# Patient Record
Sex: Female | Born: 1980 | Race: White | Hispanic: No | Marital: Married | Smoking: Never smoker
Health system: Southern US, Community
[De-identification: ages and names within clinical notes are randomized; demographics above are authoritative.]

## PROBLEM LIST (undated history)

## (undated) DIAGNOSIS — Z9889 Other specified postprocedural states: Secondary | ICD-10-CM

## (undated) DIAGNOSIS — G43909 Migraine, unspecified, not intractable, without status migrainosus: Secondary | ICD-10-CM

## (undated) DIAGNOSIS — J302 Other seasonal allergic rhinitis: Secondary | ICD-10-CM

## (undated) DIAGNOSIS — R112 Nausea with vomiting, unspecified: Secondary | ICD-10-CM

## (undated) DIAGNOSIS — E063 Autoimmune thyroiditis: Secondary | ICD-10-CM

## (undated) HISTORY — DX: Migraine, unspecified, not intractable, without status migrainosus: G43.909

## (undated) HISTORY — DX: Other seasonal allergic rhinitis: J30.2

## (undated) HISTORY — PX: TONSILLECTOMY AND ADENOIDECTOMY: SHX28

---

## 2008-08-02 HISTORY — PX: KNEE ARTHROSCOPY: SHX127

## 2010-10-13 DIAGNOSIS — J309 Allergic rhinitis, unspecified: Secondary | ICD-10-CM | POA: Insufficient documentation

## 2011-11-20 DIAGNOSIS — M25569 Pain in unspecified knee: Secondary | ICD-10-CM | POA: Insufficient documentation

## 2011-12-04 HISTORY — PX: KNEE ARTHROSCOPY: SHX127

## 2011-12-21 DIAGNOSIS — M23302 Other meniscus derangements, unspecified lateral meniscus, unspecified knee: Secondary | ICD-10-CM | POA: Insufficient documentation

## 2013-11-02 NOTE — L&D Delivery Note (Signed)
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  Presentation: vertex; Position: Right occiput anterior; Station: +3.  Verbal consent: obtained from patient.  Risks and benefits discussed in detail.  Risks include, but are not limited to the risks of anesthesia, bleeding, infection, damage to maternal tissues, fetal cephalhematoma.  There is also the risk of inability to effect vaginal delivery of the head, or shoulder dystocia that cannot be resolved by established maneuvers, leading to the need for emergency cesarean section.  APGAR: 7, 9; weight  .   Placenta status: Intact, Spontaneous.   Cord: 3 vessels with the following complications: None.  Cord pH: 7.16 Double nuchal cord  Anesthesia: Epidural  Instruments: kiwi vacuum; 3 contractions total before delivery of head. Episiotomy: None Lacerations: 2nd degree;Perineal;Sulcus bilateral, minor labial lacerations bilateral Suture Repair: 2.0 vicryl adn 2.0 Monocryl Est. Blood Loss (mL):  550 (from lacerations, no uterine atony Tissue is very edematous and friable, small amount of oozing from minor abrasions.  Vaginal packing placed to help with hemostasis.  Foley to bladder overnight.  Toradol x1 for pain.  Narcotics as needed.  Mom to postpartum.  Baby to NICU.  Kostas Marrow H. 10/31/2014, 12:54 PM

## 2014-03-09 LAB — OB RESULTS CONSOLE PLATELET COUNT: Platelets: 228 10*3/uL

## 2014-03-09 LAB — OB RESULTS CONSOLE RUBELLA ANTIBODY, IGM: Rubella: IMMUNE

## 2014-03-09 LAB — OB RESULTS CONSOLE HGB/HCT, BLOOD
HCT: 38 %
Hemoglobin: 13.1 g/dL

## 2014-03-09 LAB — OB RESULTS CONSOLE VARICELLA ZOSTER ANTIBODY, IGG: VARICELLA IGG: IMMUNE

## 2014-03-09 LAB — OB RESULTS CONSOLE RPR: RPR: NONREACTIVE

## 2014-03-09 LAB — OB RESULTS CONSOLE HEPATITIS B SURFACE ANTIGEN: Hepatitis B Surface Ag: NEGATIVE

## 2014-03-09 LAB — OB RESULTS CONSOLE HIV ANTIBODY (ROUTINE TESTING): HIV: NONREACTIVE

## 2014-05-07 ENCOUNTER — Ambulatory Visit (INDEPENDENT_AMBULATORY_CARE_PROVIDER_SITE_OTHER): Payer: BC Managed Care – PPO | Admitting: Obstetrics & Gynecology

## 2014-05-07 ENCOUNTER — Encounter: Payer: Self-pay | Admitting: Obstetrics & Gynecology

## 2014-05-07 VITALS — BP 119/68 | HR 86 | Ht 66.0 in | Wt 149.0 lb

## 2014-05-07 DIAGNOSIS — Z3480 Encounter for supervision of other normal pregnancy, unspecified trimester: Secondary | ICD-10-CM

## 2014-05-07 DIAGNOSIS — Z3493 Encounter for supervision of normal pregnancy, unspecified, third trimester: Secondary | ICD-10-CM

## 2014-05-07 DIAGNOSIS — R519 Headache, unspecified: Secondary | ICD-10-CM

## 2014-05-07 DIAGNOSIS — O26893 Other specified pregnancy related conditions, third trimester: Secondary | ICD-10-CM

## 2014-05-07 DIAGNOSIS — Z3490 Encounter for supervision of normal pregnancy, unspecified, unspecified trimester: Secondary | ICD-10-CM | POA: Insufficient documentation

## 2014-05-07 DIAGNOSIS — O9989 Other specified diseases and conditions complicating pregnancy, childbirth and the puerperium: Secondary | ICD-10-CM

## 2014-05-07 DIAGNOSIS — R51 Headache: Secondary | ICD-10-CM

## 2014-05-07 MED ORDER — SUMATRIPTAN SUCCINATE 100 MG PO TABS: 100.0000 mg | ORAL_TABLET | Freq: Once | ORAL | Status: DC | PRN

## 2014-05-07 MED ORDER — BUTALBITAL-APAP-CAFFEINE 50-325-40 MG PO CAPS: 1.0000 | ORAL_CAPSULE | Freq: Four times a day (QID) | ORAL | Status: AC | PRN

## 2014-05-07 NOTE — Progress Notes (Signed)
Transfer of care from [facility]. Had normal InformaSeq (NIPS) testing. Anatomy scan ordered.  No problems so far except for headaches.  Discussed management options including Sumatriptan and Fioricet.  Both prescribed. May see Jannifer RodneyLinda Barefoot, NP (Headache Specialist) if needed. No other complaints or concerns.  Routine obstetric precautions reviewed.

## 2014-05-07 NOTE — Progress Notes (Signed)
Pt is having headaches and uses Tylenol but it doesn't seem to help much.

## 2014-05-07 NOTE — Patient Instructions (Signed)
Return to clinic for any obstetric concerns or go to [facility] for evaluation  

## 2014-05-28 ENCOUNTER — Ambulatory Visit (HOSPITAL_COMMUNITY)
Admission: RE | Admit: 2014-05-28 | Discharge: 2014-05-28 | Disposition: A | Discharge status: Home / Self Care | Payer: BC Managed Care – PPO | Source: Ambulatory Visit | Attending: Obstetrics & Gynecology | Admitting: Obstetrics & Gynecology

## 2014-05-28 ENCOUNTER — Encounter: Payer: Self-pay | Admitting: Obstetrics & Gynecology

## 2014-05-28 DIAGNOSIS — Z3689 Encounter for other specified antenatal screening: Secondary | ICD-10-CM | POA: Insufficient documentation

## 2014-05-28 DIAGNOSIS — Z3493 Encounter for supervision of normal pregnancy, unspecified, third trimester: Secondary | ICD-10-CM

## 2014-06-04 ENCOUNTER — Encounter: Payer: Self-pay | Admitting: Family Medicine

## 2014-06-04 ENCOUNTER — Ambulatory Visit (INDEPENDENT_AMBULATORY_CARE_PROVIDER_SITE_OTHER): Payer: BC Managed Care – PPO | Admitting: Family Medicine

## 2014-06-04 VITALS — BP 113/66 | HR 97 | Wt 154.0 lb

## 2014-06-04 DIAGNOSIS — Z3492 Encounter for supervision of normal pregnancy, unspecified, second trimester: Secondary | ICD-10-CM

## 2014-06-04 DIAGNOSIS — Z3480 Encounter for supervision of other normal pregnancy, unspecified trimester: Secondary | ICD-10-CM

## 2014-06-04 DIAGNOSIS — G43109 Migraine with aura, not intractable, without status migrainosus: Secondary | ICD-10-CM

## 2014-06-04 NOTE — Patient Instructions (Signed)
Second Trimester of Pregnancy The second trimester is from week 13 through week 28, months 4 through 6. The second trimester is often a time when you feel your best. Your body has also adjusted to being pregnant, and you begin to feel better physically. Usually, morning sickness has lessened or quit completely, you may have more energy, and you may have an increase in appetite. The second trimester is also a time when the fetus is growing rapidly. At the end of the sixth month, the fetus is about 9 inches long and weighs about 1 pounds. You will likely begin to feel the baby move (quickening) between 18 and 20 weeks of the pregnancy. BODY CHANGES Your body goes through many changes during pregnancy. The changes vary from woman to woman.   Your weight will continue to increase. You will notice your lower abdomen bulging out.  You may begin to get stretch marks on your hips, abdomen, and breasts.  You may develop headaches that can be relieved by medicines approved by your health care provider.  You may urinate more often because the fetus is pressing on your bladder.  You may develop or continue to have heartburn as a result of your pregnancy.  You may develop constipation because certain hormones are causing the muscles that push waste through your intestines to slow down.  You may develop hemorrhoids or swollen, bulging veins (varicose veins).  You may have back pain because of the weight gain and pregnancy hormones relaxing your joints between the bones in your pelvis and as a result of a shift in weight and the muscles that support your balance.  Your breasts will continue to grow and be tender.  Your gums may bleed and may be sensitive to brushing and flossing.  Dark spots or blotches (chloasma, mask of pregnancy) may develop on your face. This will likely fade after the baby is born.  A dark line from your belly button to the pubic area (linea nigra) may appear. This will likely  fade after the baby is born.  You may have changes in your hair. These can include thickening of your hair, rapid growth, and changes in texture. Some women also have hair loss during or after pregnancy, or hair that feels dry or thin. Your hair will most likely return to normal after your baby is born. WHAT TO EXPECT AT YOUR PRENATAL VISITS During a routine prenatal visit:  You will be weighed to make sure you and the fetus are growing normally.  Your blood pressure will be taken.  Your abdomen will be measured to track your baby's growth.  The fetal heartbeat will be listened to.  Any test results from the previous visit will be discussed. Your health care provider may ask you:  How you are feeling.  If you are feeling the baby move.  If you have had any abnormal symptoms, such as leaking fluid, bleeding, severe headaches, or abdominal cramping.  If you have any questions. Other tests that may be performed during your second trimester include:  Blood tests that check for:  Low iron levels (anemia).  Gestational diabetes (between 24 and 28 weeks).  Rh antibodies.  Urine tests to check for infections, diabetes, or protein in the urine.  An ultrasound to confirm the proper growth and development of the baby.  An amniocentesis to check for possible genetic problems.  Fetal screens for spina bifida and Down syndrome. HOME CARE INSTRUCTIONS   Avoid all smoking, herbs, alcohol, and unprescribed   drugs. These chemicals affect the formation and growth of the baby.  Follow your health care provider's instructions regarding medicine use. There are medicines that are either safe or unsafe to take during pregnancy.  Exercise only as directed by your health care provider. Experiencing uterine cramps is a good sign to stop exercising.  Continue to eat regular, healthy meals.  Wear a good support bra for breast tenderness.  Do not use hot tubs, steam rooms, or saunas.  Wear  your seat belt at all times when driving.  Avoid raw meat, uncooked cheese, cat litter boxes, and soil used by cats. These carry germs that can cause birth defects in the baby.  Take your prenatal vitamins.  Try taking a stool softener (if your health care provider approves) if you develop constipation. Eat more high-fiber foods, such as fresh vegetables or fruit and whole grains. Drink plenty of fluids to keep your urine clear or pale yellow.  Take warm sitz baths to soothe any pain or discomfort caused by hemorrhoids. Use hemorrhoid cream if your health care provider approves.  If you develop varicose veins, wear support hose. Elevate your feet for 15 minutes, 3-4 times a day. Limit salt in your diet.  Avoid heavy lifting, wear low heel shoes, and practice good posture.  Rest with your legs elevated if you have leg cramps or low back pain.  Visit your dentist if you have not gone yet during your pregnancy. Use a soft toothbrush to brush your teeth and be gentle when you floss.  A sexual relationship may be continued unless your health care provider directs you otherwise.  Continue to go to all your prenatal visits as directed by your health care provider. SEEK MEDICAL CARE IF:   You have dizziness.  You have mild pelvic cramps, pelvic pressure, or nagging pain in the abdominal area.  You have persistent nausea, vomiting, or diarrhea.  You have a bad smelling vaginal discharge.  You have pain with urination. SEEK IMMEDIATE MEDICAL CARE IF:   You have a fever.  You are leaking fluid from your vagina.  You have spotting or bleeding from your vagina.  You have severe abdominal cramping or pain.  You have rapid weight gain or loss.  You have shortness of breath with chest pain.  You notice sudden or extreme swelling of your face, hands, ankles, feet, or legs.  You have not felt your baby move in over an hour.  You have severe headaches that do not go away with  medicine.  You have vision changes. Document Released: 10/13/2001 Document Revised: 10/24/2013 Document Reviewed: 12/20/2012 Exit[employer] Patient Information 2015 [employer]. This information is not intended to replace advice given to you by your health care provider. Make sure you discuss any questions you have with your health care provider.  Breastfeeding Deciding to breastfeed is one of the best choices you can make for you and your baby. A change in hormones during pregnancy causes your breast tissue to grow and increases the number and size of your milk ducts. These hormones also allow proteins, sugars, and fats from your blood supply to make breast milk in your milk-producing glands. Hormones prevent breast milk from being released before your baby is born as well as prompt milk flow after birth. Once breastfeeding has begun, thoughts of your baby, as well as his or her sucking or crying, can stimulate the release of milk from your milk-producing glands.  BENEFITS OF BREASTFEEDING For Your Baby  Your first   milk (colostrum) helps your baby's digestive system function better.   There are antibodies in your milk that help your baby fight off infections.   Your baby has a lower incidence of asthma, allergies, and sudden infant death syndrome.   The nutrients in breast milk are better for your baby than infant formulas and are designed uniquely for your baby's needs.   Breast milk improves your baby's brain development.   Your baby is less likely to develop other conditions, such as childhood obesity, asthma, or type 2 diabetes mellitus.  For You   Breastfeeding helps to create a very special bond between you and your baby.   Breastfeeding is convenient. Breast milk is always available at the correct temperature and costs nothing.   Breastfeeding helps to burn calories and helps you lose the weight gained during pregnancy.   Breastfeeding makes your uterus contract to its  prepregnancy size faster and slows bleeding (lochia) after you give birth.   Breastfeeding helps to lower your risk of developing type 2 diabetes mellitus, osteoporosis, and breast or ovarian cancer later in life. SIGNS THAT YOUR BABY IS HUNGRY Early Signs of Hunger  Increased alertness or activity.  Stretching.  Movement of the head from side to side.  Movement of the head and opening of the mouth when the corner of the mouth or cheek is stroked (rooting).  Increased sucking sounds, smacking lips, cooing, sighing, or squeaking.  Hand-to-mouth movements.  Increased sucking of fingers or hands. Late Signs of Hunger  Fussing.  Intermittent crying. Extreme Signs of Hunger Signs of extreme hunger will require calming and consoling before your baby will be able to breastfeed successfully. Do not wait for the following signs of extreme hunger to occur before you initiate breastfeeding:   Restlessness.  A loud, strong cry.   Screaming. BREASTFEEDING BASICS Breastfeeding Initiation  Find a comfortable place to sit or lie down, with your neck and back well supported.  Place a pillow or rolled up blanket under your baby to bring him or her to the level of your breast (if you are seated). Nursing pillows are specially designed to help support your arms and your baby while you breastfeed.  Make sure that your baby's abdomen is facing your abdomen.   Gently massage your breast. With your fingertips, massage from your chest wall toward your nipple in a circular motion. This encourages milk flow. You may need to continue this action during the feeding if your milk flows slowly.  Support your breast with 4 fingers underneath and your thumb above your nipple. Make sure your fingers are well away from your nipple and your baby's mouth.   Stroke your baby's lips gently with your finger or nipple.   When your baby's mouth is open wide enough, quickly bring your baby to your breast,  placing your entire nipple and as much of the colored area around your nipple (areola) as possible into your baby's mouth.   More areola should be visible above your baby's upper lip than below the lower lip.   Your baby's tongue should be between his or her lower gum and your breast.   Ensure that your baby's mouth is correctly positioned around your nipple (latched). Your baby's lips should create a seal on your breast and be turned out (everted).  It is common for your baby to suck about 2-3 minutes in order to start the flow of breast milk. Latching Teaching your baby how to latch on to your breast   properly is very important. An improper latch can cause nipple pain and decreased milk supply for you and poor weight gain in your baby. Also, if your baby is not latched onto your nipple properly, he or she may swallow some air during feeding. This can make your baby fussy. Burping your baby when you switch breasts during the feeding can help to get rid of the air. However, teaching your baby to latch on properly is still the best way to prevent fussiness from swallowing air while breastfeeding. Signs that your baby has successfully latched on to your nipple:    Silent tugging or silent sucking, without causing you pain.   Swallowing heard between every 3-4 sucks.    Muscle movement above and in front of his or her ears while sucking.  Signs that your baby has not successfully latched on to nipple:   Sucking sounds or smacking sounds from your baby while breastfeeding.  Nipple pain. If you think your baby has not latched on correctly, slip your finger into the corner of your baby's mouth to break the suction and place it between your baby's gums. Attempt breastfeeding initiation again. Signs of Successful Breastfeeding Signs from your baby:   A gradual decrease in the number of sucks or complete cessation of sucking.   Falling asleep.   Relaxation of his or her body.    Retention of a small amount of milk in his or her mouth.   Letting go of your breast by himself or herself. Signs from you:  Breasts that have increased in firmness, weight, and size 1-3 hours after feeding.   Breasts that are softer immediately after breastfeeding.  Increased milk volume, as well as a change in milk consistency and color by the fifth day of breastfeeding.   Nipples that are not sore, cracked, or bleeding. Signs That Your Baby is Getting Enough Milk  Wetting at least 3 diapers in a 24-hour period. The urine should be clear and pale yellow by age 5 days.  At least 3 stools in a 24-hour period by age 5 days. The stool should be soft and yellow.  At least 3 stools in a 24-hour period by age 7 days. The stool should be seedy and yellow.  No loss of weight greater than 10% of birth weight during the first 3 days of age.  Average weight gain of 4-7 ounces (113-198 g) per week after age 4 days.  Consistent daily weight gain by age 5 days, without weight loss after the age of 2 weeks. After a feeding, your baby may spit up a small amount. This is common. BREASTFEEDING FREQUENCY AND DURATION Frequent feeding will help you make more milk and can prevent sore nipples and breast engorgement. Breastfeed when you feel the need to reduce the fullness of your breasts or when your baby shows signs of hunger. This is called "breastfeeding on demand." Avoid introducing a pacifier to your baby while you are working to establish breastfeeding (the first 4-6 weeks after your baby is born). After this time you may choose to use a pacifier. Research has shown that pacifier use during the first year of a baby's life decreases the risk of sudden infant death syndrome (SIDS). Allow your baby to feed on each breast as long as he or she wants. Breastfeed until your baby is finished feeding. When your baby unlatches or falls asleep while feeding from the first breast, offer the second breast.  Because newborns are often sleepy in the   first few weeks of life, you may need to awaken your baby to get him or her to feed. Breastfeeding times will vary from baby to baby. However, the following rules can serve as a guide to help you ensure that your baby is properly fed:  Newborns (babies 4 weeks of age or younger) may breastfeed every 1-3 hours.  Newborns should not go longer than 3 hours during the day or 5 hours during the night without breastfeeding.  You should breastfeed your baby a minimum of 8 times in a 24-hour period until you begin to introduce solid foods to your baby at around 6 months of age. BREAST MILK PUMPING Pumping and storing breast milk allows you to ensure that your baby is exclusively fed your breast milk, even at times when you are unable to breastfeed. This is especially important if you are going back to work while you are still breastfeeding or when you are not able to be present during feedings. Your lactation consultant can give you guidelines on how long it is safe to store breast milk.  A breast pump is a machine that allows you to pump milk from your breast into a sterile bottle. The pumped breast milk can then be stored in a refrigerator or freezer. Some breast pumps are operated by hand, while others use electricity. Ask your lactation consultant which type will work best for you. Breast pumps can be purchased, but some hospitals and breastfeeding support groups lease breast pumps on a monthly basis. A lactation consultant can teach you how to hand express breast milk, if you prefer not to use a pump.  CARING FOR YOUR BREASTS WHILE YOU BREASTFEED Nipples can become dry, cracked, and sore while breastfeeding. The following recommendations can help keep your breasts moisturized and healthy:  Avoid using soap on your nipples.   Wear a supportive bra. Although not required, special nursing bras and tank tops are designed to allow access to your breasts for  breastfeeding without taking off your entire bra or top. Avoid wearing underwire-style bras or extremely tight bras.  Air dry your nipples for 3-4minutes after each feeding.   Use only cotton bra pads to absorb leaked breast milk. Leaking of breast milk between feedings is normal.   Use lanolin on your nipples after breastfeeding. Lanolin helps to maintain your skin's normal moisture barrier. If you use pure lanolin, you do not need to wash it off before feeding your baby again. Pure lanolin is not toxic to your baby. You may also hand express a few drops of breast milk and gently massage that milk into your nipples and allow the milk to air dry. In the first few weeks after giving birth, some women experience extremely full breasts (engorgement). Engorgement can make your breasts feel heavy, warm, and tender to the touch. Engorgement peaks within 3-5 days after you give birth. The following recommendations can help ease engorgement:  Completely empty your breasts while breastfeeding or pumping. You may want to start by applying warm, moist heat (in the shower or with warm water-soaked hand towels) just before feeding or pumping. This increases circulation and helps the milk flow. If your baby does not completely empty your breasts while breastfeeding, pump any extra milk after he or she is finished.  Wear a snug bra (nursing or regular) or tank top for 1-2 days to signal your body to slightly decrease milk production.  Apply ice packs to your breasts, unless this is too uncomfortable for you.    Make sure that your baby is latched on and positioned properly while breastfeeding. If engorgement persists after 48 hours of following these recommendations, contact your health care provider or a lactation consultant. OVERALL HEALTH CARE RECOMMENDATIONS WHILE BREASTFEEDING  Eat healthy foods. Alternate between meals and snacks, eating 3 of each per day. Because what you eat affects your breast milk,  some of the foods may make your baby more irritable than usual. Avoid eating these foods if you are sure that they are negatively affecting your baby.  Drink milk, fruit juice, and water to satisfy your thirst (about 10 glasses a day).   Rest often, relax, and continue to take your prenatal vitamins to prevent fatigue, stress, and anemia.  Continue breast self-awareness checks.  Avoid chewing and smoking tobacco.  Avoid alcohol and drug use. Some medicines that may be harmful to your baby can pass through breast milk. It is important to ask your health care provider before taking any medicine, including all over-the-counter and prescription medicine as well as vitamin and herbal supplements. It is possible to become pregnant while breastfeeding. If birth control is desired, ask your health care provider about options that will be safe for your baby. SEEK MEDICAL CARE IF:   You feel like you want to stop breastfeeding or have become frustrated with breastfeeding.  You have painful breasts or nipples.  Your nipples are cracked or bleeding.  Your breasts are red, tender, or warm.  You have a swollen area on either breast.  You have a fever or chills.  You have nausea or vomiting.  You have drainage other than breast milk from your nipples.  Your breasts do not become full before feedings by the fifth day after you give birth.  You feel sad and depressed.  Your baby is too sleepy to eat well.  Your baby is having trouble sleeping.   Your baby is wetting less than 3 diapers in a 24-hour period.  Your baby has less than 3 stools in a 24-hour period.  Your baby's skin or the white part of his or her eyes becomes yellow.   Your baby is not gaining weight by 5 days of age. SEEK IMMEDIATE MEDICAL CARE IF:   Your baby is overly tired (lethargic) and does not want to wake up and feed.  Your baby develops an unexplained fever. Document Released: 10/19/2005 Document Revised:  10/24/2013 Document Reviewed: 04/12/2013 [employer] Patient Information 2015 [employer]. This information is not intended to replace advice given to you by your health care provider. Make sure you discuss any questions you have with your health care provider.  

## 2014-06-04 NOTE — Progress Notes (Signed)
Doing well Normal anatomy 

## 2014-07-02 ENCOUNTER — Ambulatory Visit (INDEPENDENT_AMBULATORY_CARE_PROVIDER_SITE_OTHER): Payer: BC Managed Care – PPO | Admitting: Obstetrics & Gynecology

## 2014-07-02 ENCOUNTER — Encounter: Payer: Self-pay | Admitting: Obstetrics & Gynecology

## 2014-07-02 VITALS — BP 116/68 | HR 95 | Wt 159.0 lb

## 2014-07-02 DIAGNOSIS — Z3480 Encounter for supervision of other normal pregnancy, unspecified trimester: Secondary | ICD-10-CM

## 2014-07-02 DIAGNOSIS — Z3492 Encounter for supervision of normal pregnancy, unspecified, second trimester: Secondary | ICD-10-CM

## 2014-07-02 NOTE — Progress Notes (Signed)
Continue Fioricet for migraines for now, will hold Imitrex.1 hr GTT/third trimester labs, Tdap next visit.  No other complaints or concerns.  Preterm labor and fetal movement precautions reviewed.

## 2014-07-02 NOTE — Patient Instructions (Addendum)
Return to clinic for any obstetric concerns or go to [facility] for evaluation Tdap Vaccine (Tetanus, Diphtheria, Pertussis): What You Need to Know 1. Why get vaccinated? Tetanus, diphtheria and pertussis can be very serious diseases, even for adolescents and adults. Tdap vaccine can protect us from these diseases. TETANUS (Lockjaw) causes painful muscle tightening and stiffness, usually all over the body.  It can lead to tightening of muscles in the head and neck so you can't open your mouth, swallow, or sometimes even breathe. Tetanus kills about 1 out of 5 people who are infected. DIPHTHERIA can cause a thick coating to form in the back of the throat.  It can lead to breathing problems, paralysis, heart failure, and death. PERTUSSIS (Whooping Cough) causes severe coughing spells, which can cause difficulty breathing, vomiting and disturbed sleep.  It can also lead to weight loss, incontinence, and rib fractures. Up to 2 in 100 adolescents and 5 in 100 adults with pertussis are hospitalized or have complications, which could include pneumonia or death. These diseases are caused by bacteria. Diphtheria and pertussis are spread from person to person through coughing or sneezing. Tetanus enters the body through cuts, scratches, or wounds. Before vaccines, the [place] saw as many as 200,000 cases a year of diphtheria and pertussis, and hundreds of cases of tetanus. Since vaccination began, tetanus and diphtheria have dropped by about 99% and pertussis by about 80%. 2. Tdap vaccine Tdap vaccine can protect adolescents and adults from tetanus, diphtheria, and pertussis. One dose of Tdap is routinely given at age 11 or 12. People who did not get Tdap at that age should get it as soon as possible. Tdap is especially important for health care professionals and anyone having close contact with a baby younger than 12 months. Pregnant women should get a dose of Tdap during every pregnancy, to protect the  newborn from pertussis. Infants are most at risk for severe, life-threatening complications from pertussis. A similar vaccine, called Td, protects from tetanus and diphtheria, but not pertussis. A Td booster should be given every 10 years. Tdap may be given as one of these boosters if you have not already gotten a dose. Tdap may also be given after a severe cut or burn to prevent tetanus infection. Your doctor can give you more information. Tdap may safely be given at the same time as other vaccines. 3. Some people should not get this vaccine  If you ever had a life-threatening allergic reaction after a dose of any tetanus, diphtheria, or pertussis containing vaccine, OR if you have a severe allergy to any part of this vaccine, you should not get Tdap. Tell your doctor if you have any severe allergies.  If you had a coma, or long or multiple seizures within 7 days after a childhood dose of DTP or DTaP, you should not get Tdap, unless a cause other than the vaccine was found. You can still get Td.  Talk to your doctor if you:  have epilepsy or another nervous system problem,  had severe pain or swelling after any vaccine containing diphtheria, tetanus or pertussis,  ever had Guillain-Barr Syndrome (GBS),  aren't feeling well on the day the shot is scheduled. 4. Risks of a vaccine reaction With any medicine, including vaccines, there is a chance of side effects. These are usually mild and go away on their own, but serious reactions are also possible. Brief fainting spells can follow a vaccination, leading to injuries from falling. Sitting or lying down for   about 15 minutes can help prevent these. Tell your doctor if you feel dizzy or light-headed, or have vision changes or ringing in the ears. Mild problems following Tdap (Did not interfere with activities)  Pain where the shot was given (about 3 in 4 adolescents or 2 in 3 adults)  Redness or swelling where the shot was given (about 1  person in 5)  Mild fever of at least 100.51F (up to about 1 in 25 adolescents or 1 in 100 adults)  Headache (about 3 or 4 people in 10)  Tiredness (about 1 person in 3 or 4)  Nausea, vomiting, diarrhea, stomach ache (up to 1 in 4 adolescents or 1 in 10 adults)  Chills, body aches, sore joints, rash, swollen glands (uncommon) Moderate problems following Tdap (Interfered with activities, but did not require medical attention)  Pain where the shot was given (about 1 in 5 adolescents or 1 in 100 adults)  Redness or swelling where the shot was given (up to about 1 in 16 adolescents or 1 in 25 adults)  Fever over 102F (about 1 in 100 adolescents or 1 in 250 adults)  Headache (about 3 in 20 adolescents or 1 in 10 adults)  Nausea, vomiting, diarrhea, stomach ache (up to 1 or 3 people in 100)  Swelling of the entire arm where the shot was given (up to about 3 in 100). Severe problems following Tdap (Unable to perform usual activities; required medical attention)  Swelling, severe pain, bleeding and redness in the arm where the shot was given (rare). A severe allergic reaction could occur after any vaccine (estimated less than 1 in a million doses). 5. What if there is a serious reaction? What should I look for?  Look for anything that concerns you, such as signs of a severe allergic reaction, very high fever, or behavior changes. Signs of a severe allergic reaction can include hives, swelling of the face and throat, difficulty breathing, a fast heartbeat, dizziness, and weakness. These would start a few minutes to a few hours after the vaccination. What should I do?  If you think it is a severe allergic reaction or other emergency that can't wait, call 9-1-1 or get the person to the nearest hospital. Otherwise, call your doctor.  Afterward, the reaction should be reported to the "Vaccine Adverse Event Reporting System" (VAERS). Your doctor might file this report, or you can do it  yourself through the VAERS web site at www.vaers.LAgents.no, or by calling 1-(509) 411-9615. VAERS is only for reporting reactions. They do not give medical advice.  6. The National Vaccine Injury Compensation Program The [employer] Vaccine Injury Compensation Program (VICP) is a federal program that was created to compensate people who may have been injured by certain vaccines. Persons who believe they may have been injured by a vaccine can learn about the program and about filing a claim by calling 1-815-790-6534 or visiting the VICP website at SpiritualWord.at. 7. How can I learn more?  Ask your doctor.  Call your local or state health department.  Contact the Centers for Disease Control and Prevention (CDC):  Call 352-190-3838 or visit CDC's website at PicCapture.uy. CDC Tdap Vaccine VIS (03/10/12) Document Released: 04/19/2012 Document Revised: 03/05/2014 Document Reviewed: 01/31/2014 [employer] Patient Information 2015 [place], [place]. This information is not intended to replace advice given to you by your health care provider. Make sure you discuss any questions you have with your health care provider.  Contraception Choices Contraception (birth control) is the use of any methods  or devices to prevent pregnancy. Below are some methods to help avoid pregnancy. HORMONAL METHODS   Contraceptive implant. This is a thin, plastic tube containing progesterone hormone. It does not contain estrogen hormone. Your health care provider inserts the tube in the inner part of the upper arm. The tube can remain in place for up to 3 years. After 3 years, the implant must be removed. The implant prevents the ovaries from releasing an egg (ovulation), thickens the cervical mucus to prevent sperm from entering the uterus, and thins the lining of the inside of the uterus.  Progesterone-only injections. These injections are given every 3 months by your health care provider to prevent  pregnancy. This synthetic progesterone hormone stops the ovaries from releasing eggs. It also thickens cervical mucus and changes the uterine lining. This makes it harder for sperm to survive in the uterus.  Birth control pills. These pills contain estrogen and progesterone hormone. They work by preventing the ovaries from releasing eggs (ovulation). They also cause the cervical mucus to thicken, preventing the sperm from entering the uterus. Birth control pills are prescribed by a health care provider.Birth control pills can also be used to treat heavy periods.  Minipill. This type of birth control pill contains only the progesterone hormone. They are taken every day of each month and must be prescribed by your health care provider.  Birth control patch. The patch contains hormones similar to those in birth control pills. It must be changed once a week and is prescribed by a health care provider.  Vaginal ring. The ring contains hormones similar to those in birth control pills. It is left in the vagina for 3 weeks, removed for 1 week, and then a new one is put back in place. The patient must be comfortable inserting and removing the ring from the vagina.A health care provider's prescription is necessary.  Emergency contraception. Emergency contraceptives prevent pregnancy after unprotected sexual intercourse. This pill can be taken right after sex or up to 5 days after unprotected sex. It is most effective the sooner you take the pills after having sexual intercourse. Most emergency contraceptive pills are available without a prescription. Check with your pharmacist. Do not use emergency contraception as your only form of birth control. BARRIER METHODS   Female condom. This is a thin sheath (latex or rubber) that is worn over the penis during sexual intercourse. It can be used with spermicide to increase effectiveness.  Female condom. This is a soft, loose-fitting sheath that is put into the vagina  before sexual intercourse.  Diaphragm. This is a soft, latex, dome-shaped barrier that must be fitted by a health care provider. It is inserted into the vagina, along with a spermicidal jelly. It is inserted before intercourse. The diaphragm should be left in the vagina for 6 to 8 hours after intercourse.  Cervical cap. This is a round, soft, latex or plastic cup that fits over the cervix and must be fitted by a health care provider. The cap can be left in place for up to 48 hours after intercourse.  Sponge. This is a soft, circular piece of polyurethane foam. The sponge has spermicide in it. It is inserted into the vagina after wetting it and before sexual intercourse.  Spermicides. These are chemicals that kill or block sperm from entering the cervix and uterus. They come in the form of creams, jellies, suppositories, foam, or tablets. They do not require a prescription. They are inserted into the vagina with an applicator  before having sexual intercourse. The process must be repeated every time you have sexual intercourse. INTRAUTERINE CONTRACEPTION  Intrauterine device (IUD). This is a T-shaped device that is put in a woman's uterus during a menstrual period to prevent pregnancy. There are 2 types:  Copper IUD. This type of IUD is wrapped in copper wire and is placed inside the uterus. Copper makes the uterus and fallopian tubes produce a fluid that kills sperm. It can stay in place for 10 years.  Hormone IUD. This type of IUD contains the hormone progestin (synthetic progesterone). The hormone thickens the cervical mucus and prevents sperm from entering the uterus, and it also thins the uterine lining to prevent implantation of a fertilized egg. The hormone can weaken or kill the sperm that get into the uterus. It can stay in place for 3-5 years, depending on which type of IUD is used. PERMANENT METHODS OF CONTRACEPTION  Female tubal ligation. This is when the woman's fallopian tubes are  surgically sealed, tied, or blocked to prevent the egg from traveling to the uterus.  Hysteroscopic sterilization. This involves placing a small coil or insert into each fallopian tube. Your doctor uses a technique called hysteroscopy to do the procedure. The device causes scar tissue to form. This results in permanent blockage of the fallopian tubes, so the sperm cannot fertilize the egg. It takes about 3 months after the procedure for the tubes to become blocked. You must use another form of birth control for these 3 months.  Female sterilization. This is when the female has the tubes that carry sperm tied off (vasectomy).This blocks sperm from entering the vagina during sexual intercourse. After the procedure, the man can still ejaculate fluid (semen). NATURAL PLANNING METHODS  Natural family planning. This is not having sexual intercourse or using a barrier method (condom, diaphragm, cervical cap) on days the woman could become pregnant.  Calendar method. This is keeping track of the length of each menstrual cycle and identifying when you are fertile.  Ovulation method. This is avoiding sexual intercourse during ovulation.  Symptothermal method. This is avoiding sexual intercourse during ovulation, using a thermometer and ovulation symptoms.  Post-ovulation method. This is timing sexual intercourse after you have ovulated. Regardless of which type or method of contraception you choose, it is important that you use condoms to protect against the transmission of sexually transmitted infections (STIs). Talk with your health care provider about which form of contraception is most appropriate for you. Document Released: 10/19/2005 Document Revised: 10/24/2013 Document Reviewed: 04/13/2013 [facility] Patient Information 2015 [place], [place]. This information is not intended to replace advice given to you by your health care provider. Make sure you discuss any questions you have with your health care  provider.

## 2014-07-02 NOTE — Progress Notes (Signed)
Tried Imitrex, worked well the first time but had side effects that she did not like after taking it so she will probably not use this again.

## 2014-08-01 ENCOUNTER — Encounter: Payer: Self-pay | Admitting: Obstetrics & Gynecology

## 2014-08-01 ENCOUNTER — Ambulatory Visit (INDEPENDENT_AMBULATORY_CARE_PROVIDER_SITE_OTHER): Payer: BC Managed Care – PPO | Admitting: Obstetrics & Gynecology

## 2014-08-01 VITALS — BP 108/69 | HR 99 | Wt 168.6 lb

## 2014-08-01 DIAGNOSIS — Z23 Encounter for immunization: Secondary | ICD-10-CM

## 2014-08-01 DIAGNOSIS — Z3492 Encounter for supervision of normal pregnancy, unspecified, second trimester: Secondary | ICD-10-CM

## 2014-08-01 DIAGNOSIS — Z3400 Encounter for supervision of normal first pregnancy, unspecified trimester: Secondary | ICD-10-CM | POA: Diagnosis not present

## 2014-08-01 DIAGNOSIS — Z3402 Encounter for supervision of normal first pregnancy, second trimester: Secondary | ICD-10-CM

## 2014-08-01 LAB — CBC
HEMATOCRIT: 35.8 % — AB (ref 36.0–46.0)
Hemoglobin: 12.1 g/dL (ref 12.0–15.0)
MCH: 30.6 pg (ref 26.0–34.0)
MCHC: 33.8 g/dL (ref 30.0–36.0)
MCV: 90.4 fL (ref 78.0–100.0)
Platelets: 198 10*3/uL (ref 150–400)
RBC: 3.96 MIL/uL (ref 3.87–5.11)
RDW: 13.1 % (ref 11.5–15.5)
WBC: 9.4 10*3/uL (ref 4.0–10.5)

## 2014-08-01 NOTE — Progress Notes (Signed)
One hour glucola today, TDAP, and Flu vaccine.

## 2014-08-01 NOTE — Progress Notes (Signed)
Routine visit. Good FM. No FM. TDAP, flu vaccines, glucola, 28 week labs. She says that she is O+ and [facility]

## 2014-08-02 LAB — GLUCOSE TOLERANCE, 1 HOUR (50G) W/O FASTING: GLUCOSE 1 HOUR GTT: 88 mg/dL (ref 70–140)

## 2014-08-02 LAB — RPR

## 2014-08-02 LAB — HIV ANTIBODY (ROUTINE TESTING W REFLEX): HIV 1&2 Ab, 4th Generation: NONREACTIVE

## 2014-08-07 ENCOUNTER — Encounter: Payer: Self-pay | Admitting: Obstetrics & Gynecology

## 2014-08-20 ENCOUNTER — Encounter: Payer: Self-pay | Admitting: Family Medicine

## 2014-08-20 ENCOUNTER — Ambulatory Visit (INDEPENDENT_AMBULATORY_CARE_PROVIDER_SITE_OTHER): Payer: BC Managed Care – PPO | Admitting: Family Medicine

## 2014-08-20 VITALS — BP 122/71 | HR 91 | Wt 172.0 lb

## 2014-08-20 DIAGNOSIS — Z3493 Encounter for supervision of normal pregnancy, unspecified, third trimester: Secondary | ICD-10-CM

## 2014-08-20 NOTE — Progress Notes (Signed)
Normal 28 wks labs Doing well. Prenatal labs reviewed--scanned under media and WNL.

## 2014-09-03 ENCOUNTER — Encounter: Payer: Self-pay | Admitting: Obstetrics & Gynecology

## 2014-09-03 ENCOUNTER — Ambulatory Visit (INDEPENDENT_AMBULATORY_CARE_PROVIDER_SITE_OTHER): Payer: BC Managed Care – PPO | Admitting: Obstetrics & Gynecology

## 2014-09-03 VITALS — BP 122/65 | HR 94 | Wt 173.0 lb

## 2014-09-03 DIAGNOSIS — Z3483 Encounter for supervision of other normal pregnancy, third trimester: Secondary | ICD-10-CM

## 2014-09-03 NOTE — Progress Notes (Signed)
Pt with no problems.  Reviewed birth plan. She is interested in a water birth and completed the class Desires to meet with midwife on next visit

## 2014-09-03 NOTE — Progress Notes (Signed)
Pt with no complaints.  +FM, NO ctx, No LOF

## 2014-09-04 ENCOUNTER — Encounter: Payer: Self-pay | Admitting: Obstetrics & Gynecology

## 2014-09-19 ENCOUNTER — Ambulatory Visit (INDEPENDENT_AMBULATORY_CARE_PROVIDER_SITE_OTHER): Payer: BC Managed Care – PPO | Admitting: Advanced Practice Midwife

## 2014-09-19 ENCOUNTER — Encounter: Payer: Self-pay | Admitting: Advanced Practice Midwife

## 2014-09-19 VITALS — BP 126/72 | HR 100 | Wt 176.8 lb

## 2014-09-19 DIAGNOSIS — Z3493 Encounter for supervision of normal pregnancy, unspecified, third trimester: Secondary | ICD-10-CM

## 2014-09-19 NOTE — Patient Instructions (Signed)
Braxton Hicks Contractions °Contractions of the uterus can occur throughout pregnancy. Contractions are not always a sign that you are in labor.  °WHAT ARE BRAXTON HICKS CONTRACTIONS?  °Contractions that occur before labor are called Braxton Hicks contractions, or false labor. Toward the end of pregnancy (32-34 weeks), these contractions can develop more often and may become more forceful. This is not true labor because these contractions do not result in opening (dilatation) and thinning of the cervix. They are sometimes difficult to tell apart from true labor because these contractions can be forceful and people have different pain tolerances. You should not feel embarrassed if you go to the hospital with false labor. Sometimes, the only way to tell if you are in true labor is for your health care provider to look for changes in the cervix. °If there are no prenatal problems or other health problems associated with the pregnancy, it is completely safe to be sent home with false labor and await the onset of true labor. °HOW CAN YOU TELL THE DIFFERENCE BETWEEN TRUE AND FALSE LABOR? °False Labor °· The contractions of false labor are usually shorter and not as hard as those of true labor.   °· The contractions are usually irregular.   °· The contractions are often felt in the front of the lower abdomen and in the groin.   °· The contractions may go away when you walk around or change positions while lying down.   °· The contractions get weaker and are shorter lasting as time goes on.   °· The contractions do not usually become progressively stronger, regular, and closer together as with true labor.   °True Labor °1. Contractions in true labor last 30-70 seconds, become very regular, usually become more intense, and increase in frequency.   °2. The contractions do not go away with walking.   °3. The discomfort is usually felt in the top of the uterus and spreads to the lower abdomen and low back.   °4. True labor can  be determined by your health care provider with an exam. This will show that the cervix is dilating and getting thinner.   °WHAT TO REMEMBER °· Keep up with your usual exercises and follow other instructions given by your health care provider.   °· Take medicines as directed by your health care provider.   °· Keep your regular prenatal appointments.   °· Eat and drink lightly if you think you are going into labor.   °· If Braxton Hicks contractions are making you uncomfortable:   °· Change your position from lying down or resting to walking, or from walking to resting.   °· Sit and rest in a tub of warm water.   °· Drink 2-3 glasses of water. Dehydration may cause these contractions.   °· Do slow and deep breathing several times an hour.   °WHEN SHOULD I SEEK IMMEDIATE MEDICAL CARE? °Seek immediate medical care if: °· Your contractions become stronger, more regular, and closer together.   °· You have fluid leaking or gushing from your vagina.   °· You have a fever.   °· You pass blood-tinged mucus.   °· You have vaginal bleeding.   °· You have continuous abdominal pain.   °· You have low back pain that you never had before.   °· You feel your baby's head pushing down and causing pelvic pressure.   °· Your baby is not moving as much as it used to.   °Document Released: 10/19/2005 Document Revised: 10/24/2013 Document Reviewed: 07/31/2013 °[employer]® Patient Information ©2015 [employer]. This information is not intended to replace advice given to you by your health care   provider. Make sure you discuss any questions you have with your health care provider. ° °Fetal Movement Counts °Patient Name: __________________________________________________ Patient Due Date: ____________________ °Performing a fetal movement count is highly recommended in high-risk pregnancies, but it is good for every pregnant woman to do. Your health care provider may ask you to start counting fetal movements at 28 weeks of the pregnancy. Fetal  movements often increase: °· After eating a full meal. °· After physical activity. °· After eating or drinking something sweet or cold. °· At rest. °Pay attention to when you feel the baby is most active. This will help you notice a pattern of your baby's sleep and wake cycles and what factors contribute to an increase in fetal movement. It is important to perform a fetal movement count at the same time each day when your baby is normally most active.  °HOW TO COUNT FETAL MOVEMENTS °5. Find a quiet and comfortable area to sit or lie down on your left side. Lying on your left side provides the best blood and oxygen circulation to your baby. °6. Write down the day and time on a sheet of paper or in a journal. °7. Start counting kicks, flutters, swishes, rolls, or jabs in a 2-hour period. You should feel at least 10 movements within 2 hours. °8. If you do not feel 10 movements in 2 hours, wait 2-3 hours and count again. Look for a change in the pattern or not enough counts in 2 hours. °SEEK MEDICAL CARE IF: °· You feel less than 10 counts in 2 hours, tried twice. °· There is no movement in over an hour. °· The pattern is changing or taking longer each day to reach 10 counts in 2 hours. °· You feel the baby is not moving as he or she usually does. °Date: ____________ Movements: ____________ Start time: ____________ Finish time: ____________  °Date: ____________ Movements: ____________ Start time: ____________ Finish time: ____________ °Date: ____________ Movements: ____________ Start time: ____________ Finish time: ____________ °Date: ____________ Movements: ____________ Start time: ____________ Finish time: ____________ °Date: ____________ Movements: ____________ Start time: ____________ Finish time: ____________ °Date: ____________ Movements: ____________ Start time: ____________ Finish time: ____________ °Date: ____________ Movements: ____________ Start time: ____________ Finish time: ____________ °Date: ____________  Movements: ____________ Start time: ____________ Finish time: ____________  °Date: ____________ Movements: ____________ Start time: ____________ Finish time: ____________ °Date: ____________ Movements: ____________ Start time: ____________ Finish time: ____________ °Date: ____________ Movements: ____________ Start time: ____________ Finish time: ____________ °Date: ____________ Movements: ____________ Start time: ____________ Finish time: ____________ °Date: ____________ Movements: ____________ Start time: ____________ Finish time: ____________ °Date: ____________ Movements: ____________ Start time: ____________ Finish time: ____________ °Date: ____________ Movements: ____________ Start time: ____________ Finish time: ____________  °Date: ____________ Movements: ____________ Start time: ____________ Finish time: ____________ °Date: ____________ Movements: ____________ Start time: ____________ Finish time: ____________ °Date: ____________ Movements: ____________ Start time: ____________ Finish time: ____________ °Date: ____________ Movements: ____________ Start time: ____________ Finish time: ____________ °Date: ____________ Movements: ____________ Start time: ____________ Finish time: ____________ °Date: ____________ Movements: ____________ Start time: ____________ Finish time: ____________ °Date: ____________ Movements: ____________ Start time: ____________ Finish time: ____________  °Date: ____________ Movements: ____________ Start time: ____________ Finish time: ____________ °Date: ____________ Movements: ____________ Start time: ____________ Finish time: ____________ °Date: ____________ Movements: ____________ Start time: ____________ Finish time: ____________ °Date: ____________ Movements: ____________ Start time: ____________ Finish time: ____________ °Date: ____________ Movements: ____________ Start time: ____________ Finish time: ____________ °Date: ____________ Movements: ____________ Start time:  ____________ Finish time: ____________ °Date: ____________ Movements:   ____________ Start time: ____________ Finish time: ____________  °Date: ____________ Movements: ____________ Start time: ____________ Finish time: ____________ °Date: ____________ Movements: ____________ Start time: ____________ Finish time: ____________ °Date: ____________ Movements: ____________ Start time: ____________ Finish time: ____________ °Date: ____________ Movements: ____________ Start time: ____________ Finish time: ____________ °Date: ____________ Movements: ____________ Start time: ____________ Finish time: ____________ °Date: ____________ Movements: ____________ Start time: ____________ Finish time: ____________ °Date: ____________ Movements: ____________ Start time: ____________ Finish time: ____________  °Date: ____________ Movements: ____________ Start time: ____________ Finish time: ____________ °Date: ____________ Movements: ____________ Start time: ____________ Finish time: ____________ °Date: ____________ Movements: ____________ Start time: ____________ Finish time: ____________ °Date: ____________ Movements: ____________ Start time: ____________ Finish time: ____________ °Date: ____________ Movements: ____________ Start time: ____________ Finish time: ____________ °Date: ____________ Movements: ____________ Start time: ____________ Finish time: ____________ °Date: ____________ Movements: ____________ Start time: ____________ Finish time: ____________  °Date: ____________ Movements: ____________ Start time: ____________ Finish time: ____________ °Date: ____________ Movements: ____________ Start time: ____________ Finish time: ____________ °Date: ____________ Movements: ____________ Start time: ____________ Finish time: ____________ °Date: ____________ Movements: ____________ Start time: ____________ Finish time: ____________ °Date: ____________ Movements: ____________ Start time: ____________ Finish time: ____________ °Date:  ____________ Movements: ____________ Start time: ____________ Finish time: ____________ °Date: ____________ Movements: ____________ Start time: ____________ Finish time: ____________  °Date: ____________ Movements: ____________ Start time: ____________ Finish time: ____________ °Date: ____________ Movements: ____________ Start time: ____________ Finish time: ____________ °Date: ____________ Movements: ____________ Start time: ____________ Finish time: ____________ °Date: ____________ Movements: ____________ Start time: ____________ Finish time: ____________ °Date: ____________ Movements: ____________ Start time: ____________ Finish time: ____________ °Date: ____________ Movements: ____________ Start time: ____________ Finish time: ____________ °Document Released: 11/18/2006 Document Revised: 03/05/2014 Document Reviewed: 08/15/2012 °[employer]® Patient Information ©2015 [employer]. This information is not intended to replace advice given to you by your health care provider. Make sure you discuss any questions you have with your health care provider. ° °

## 2014-09-19 NOTE — Progress Notes (Signed)
Discussed waterbirth. Consents signed. Class done.

## 2014-09-26 ENCOUNTER — Ambulatory Visit (INDEPENDENT_AMBULATORY_CARE_PROVIDER_SITE_OTHER): Payer: BC Managed Care – PPO | Admitting: Physician Assistant

## 2014-09-26 VITALS — BP 127/78 | HR 113 | Wt 178.0 lb

## 2014-09-26 DIAGNOSIS — Z36 Encounter for antenatal screening of mother: Secondary | ICD-10-CM

## 2014-09-26 DIAGNOSIS — Z3403 Encounter for supervision of normal first pregnancy, third trimester: Secondary | ICD-10-CM

## 2014-09-26 NOTE — Patient Instructions (Signed)
Third Trimester of Pregnancy The third trimester is from week 29 through week 42, months 7 through 9. The third trimester is a time when the fetus is growing rapidly. At the end of the ninth month, the fetus is about 20 inches in length and weighs 6-10 pounds.  BODY CHANGES Your body goes through many changes during pregnancy. The changes vary from woman to woman.   Your weight will continue to increase. You can expect to gain 25-35 pounds (11-16 kg) by the end of the pregnancy.  You may begin to get stretch marks on your hips, abdomen, and breasts.  You may urinate more often because the fetus is moving lower into your pelvis and pressing on your bladder.  You may develop or continue to have heartburn as a result of your pregnancy.  You may develop constipation because certain hormones are causing the muscles that push waste through your intestines to slow down.  You may develop hemorrhoids or swollen, bulging veins (varicose veins).  You may have pelvic pain because of the weight gain and pregnancy hormones relaxing your joints between the bones in your pelvis. Backaches may result from overexertion of the muscles supporting your posture.  You may have changes in your hair. These can include thickening of your hair, rapid growth, and changes in texture. Some women also have hair loss during or after pregnancy, or hair that feels dry or thin. Your hair will most likely return to normal after your baby is born.  Your breasts will continue to grow and be tender. A yellow discharge may leak from your breasts called colostrum.  Your belly button may stick out.  You may feel short of breath because of your expanding uterus.  You may notice the fetus "dropping," or moving lower in your abdomen.  You may have a bloody mucus discharge. This usually occurs a few days to a week before labor begins.  Your cervix becomes thin and soft (effaced) near your due date. WHAT TO EXPECT AT YOUR PRENATAL  EXAMS  You will have prenatal exams every 2 weeks until week 36. Then, you will have weekly prenatal exams. During a routine prenatal visit:  You will be weighed to make sure you and the fetus are growing normally.  Your blood pressure is taken.  Your abdomen will be measured to track your baby's growth.  The fetal heartbeat will be listened to.  Any test results from the previous visit will be discussed.  You may have a cervical check near your due date to see if you have effaced. At around 36 weeks, your caregiver will check your cervix. At the same time, your caregiver will also perform a test on the secretions of the vaginal tissue. This test is to determine if a type of bacteria, Group B streptococcus, is present. Your caregiver will explain this further. Your caregiver may ask you:  What your birth plan is.  How you are feeling.  If you are feeling the baby move.  If you have had any abnormal symptoms, such as leaking fluid, bleeding, severe headaches, or abdominal cramping.  If you have any questions. Other tests or screenings that may be performed during your third trimester include:  Blood tests that check for low iron levels (anemia).  Fetal testing to check the health, activity level, and growth of the fetus. Testing is done if you have certain medical conditions or if there are problems during the pregnancy. FALSE LABOR You may feel small, irregular contractions that   eventually go away. These are called Braxton Hicks contractions, or false labor. Contractions may last for hours, days, or even weeks before true labor sets in. If contractions come at regular intervals, intensify, or become painful, it is best to be seen by your caregiver.  SIGNS OF LABOR   Menstrual-like cramps.  Contractions that are 5 minutes apart or less.  Contractions that start on the top of the uterus and spread down to the lower abdomen and back.  A sense of increased pelvic pressure or back  pain.  A watery or bloody mucus discharge that comes from the vagina. If you have any of these signs before the 37th week of pregnancy, call your caregiver right away. You need to go to the hospital to get checked immediately. HOME CARE INSTRUCTIONS   Avoid all smoking, herbs, alcohol, and unprescribed drugs. These chemicals affect the formation and growth of the baby.  Follow your caregiver's instructions regarding medicine use. There are medicines that are either safe or unsafe to take during pregnancy.  Exercise only as directed by your caregiver. Experiencing uterine cramps is a good sign to stop exercising.  Continue to eat regular, healthy meals.  Wear a good support bra for breast tenderness.  Do not use hot tubs, steam rooms, or saunas.  Wear your seat belt at all times when driving.  Avoid raw meat, uncooked cheese, cat litter boxes, and soil used by cats. These carry germs that can cause birth defects in the baby.  Take your prenatal vitamins.  Try taking a stool softener (if your caregiver approves) if you develop constipation. Eat more high-fiber foods, such as fresh vegetables or fruit and whole grains. Drink plenty of fluids to keep your urine clear or pale yellow.  Take warm sitz baths to soothe any pain or discomfort caused by hemorrhoids. Use hemorrhoid cream if your caregiver approves.  If you develop varicose veins, wear support hose. Elevate your feet for 15 minutes, 3-4 times a day. Limit salt in your diet.  Avoid heavy lifting, wear low heal shoes, and practice good posture.  Rest a lot with your legs elevated if you have leg cramps or low back pain.  Visit your dentist if you have not gone during your pregnancy. Use a soft toothbrush to brush your teeth and be gentle when you floss.  A sexual relationship may be continued unless your caregiver directs you otherwise.  Do not travel far distances unless it is absolutely necessary and only with the approval  of your caregiver.  Take prenatal classes to understand, practice, and ask questions about the labor and delivery.  Make a trial run to the hospital.  Pack your hospital bag.  Prepare the baby's nursery.  Continue to go to all your prenatal visits as directed by your caregiver. SEEK MEDICAL CARE IF:  You are unsure if you are in labor or if your water has broken.  You have dizziness.  You have mild pelvic cramps, pelvic pressure, or nagging pain in your abdominal area.  You have persistent nausea, vomiting, or diarrhea.  You have a bad smelling vaginal discharge.  You have pain with urination. SEEK IMMEDIATE MEDICAL CARE IF:   You have a fever.  You are leaking fluid from your vagina.  You have spotting or bleeding from your vagina.  You have severe abdominal cramping or pain.  You have rapid weight loss or gain.  You have shortness of breath with chest pain.  You notice sudden or extreme swelling   of your face, hands, ankles, feet, or legs.  You have not felt your baby move in over an hour.  You have severe headaches that do not go away with medicine.  You have vision changes. Document Released: 10/13/2001 Document Revised: 10/24/2013 Document Reviewed: 12/20/2012 [employer] Patient Information 2015 [employer]. This information is not intended to replace advice given to you by your health care provider. Make sure you discuss any questions you have with your health care provider.  

## 2014-09-26 NOTE — Progress Notes (Signed)
36 weeks.  Stable without complications.  Endorses good fetal movement.  Denies LOF, vag bleeding, dysuria.   Labor precautions discussed PNV qd  RTC 1 week

## 2014-09-27 LAB — GC/CHLAMYDIA PROBE AMP
CT PROBE, AMP APTIMA: NEGATIVE
GC PROBE AMP APTIMA: NEGATIVE

## 2014-09-27 LAB — CULTURE, BETA STREP (GROUP B ONLY)

## 2014-10-02 ENCOUNTER — Encounter: Payer: Self-pay | Admitting: Obstetrics & Gynecology

## 2014-10-02 ENCOUNTER — Ambulatory Visit (INDEPENDENT_AMBULATORY_CARE_PROVIDER_SITE_OTHER): Payer: BC Managed Care – PPO | Admitting: Obstetrics & Gynecology

## 2014-10-02 VITALS — BP 107/74 | HR 100 | Wt 180.0 lb

## 2014-10-02 DIAGNOSIS — Z3490 Encounter for supervision of normal pregnancy, unspecified, unspecified trimester: Secondary | ICD-10-CM

## 2014-10-02 DIAGNOSIS — Z3403 Encounter for supervision of normal first pregnancy, third trimester: Secondary | ICD-10-CM

## 2014-10-02 NOTE — Progress Notes (Signed)
Routine visit. Good FM "All the time". Labor precautions reviewed. She denies any problems.

## 2014-10-11 ENCOUNTER — Ambulatory Visit (INDEPENDENT_AMBULATORY_CARE_PROVIDER_SITE_OTHER): Payer: BC Managed Care – PPO | Admitting: Obstetrics and Gynecology

## 2014-10-11 ENCOUNTER — Encounter: Payer: Self-pay | Admitting: Obstetrics and Gynecology

## 2014-10-11 VITALS — BP 127/75 | HR 109 | Wt 181.0 lb

## 2014-10-11 DIAGNOSIS — Z3493 Encounter for supervision of normal pregnancy, unspecified, third trimester: Secondary | ICD-10-CM

## 2014-10-11 NOTE — Progress Notes (Signed)
Patient is doing well without complaints. FM/PTL precautions reviewed.  

## 2014-10-17 ENCOUNTER — Ambulatory Visit (INDEPENDENT_AMBULATORY_CARE_PROVIDER_SITE_OTHER): Payer: BC Managed Care – PPO | Admitting: Obstetrics and Gynecology

## 2014-10-17 ENCOUNTER — Encounter: Payer: Self-pay | Admitting: Obstetrics and Gynecology

## 2014-10-17 VITALS — BP 120/79 | Wt 181.0 lb

## 2014-10-17 DIAGNOSIS — Z3493 Encounter for supervision of normal pregnancy, unspecified, third trimester: Secondary | ICD-10-CM

## 2014-10-17 NOTE — Progress Notes (Signed)
Patient is doing well without complaints. FM/labor precautions reviewed. She declined vaginal exam today and would like IOL at [id number] if possible

## 2014-10-24 ENCOUNTER — Encounter: Payer: Self-pay | Admitting: Obstetrics and Gynecology

## 2014-10-24 ENCOUNTER — Ambulatory Visit (INDEPENDENT_AMBULATORY_CARE_PROVIDER_SITE_OTHER): Payer: BC Managed Care – PPO | Admitting: Obstetrics and Gynecology

## 2014-10-24 VITALS — BP 105/83 | HR 114 | Wt 183.0 lb

## 2014-10-24 DIAGNOSIS — O480 Post-term pregnancy: Secondary | ICD-10-CM

## 2014-10-24 DIAGNOSIS — Z3A40 40 weeks gestation of pregnancy: Secondary | ICD-10-CM

## 2014-10-24 DIAGNOSIS — Z3493 Encounter for supervision of normal pregnancy, unspecified, third trimester: Secondary | ICD-10-CM

## 2014-10-24 NOTE — Progress Notes (Signed)
Patient is doing well without complaints. Reluctant to be induced. FM/labor precautions reviewed. IOL scheduled on 12/31 at 7:30 am NST reviewed and reactive

## 2014-10-30 ENCOUNTER — Ambulatory Visit (INDEPENDENT_AMBULATORY_CARE_PROVIDER_SITE_OTHER): Payer: BC Managed Care – PPO | Admitting: Obstetrics & Gynecology

## 2014-10-30 ENCOUNTER — Inpatient Hospital Stay (HOSPITAL_COMMUNITY)
Admission: AD | Admit: 2014-10-30 | Discharge: 2014-11-02 | DRG: 775 | Disposition: A | Discharge status: Home / Self Care | Payer: BC Managed Care – PPO | Source: Ambulatory Visit | Attending: Obstetrics & Gynecology | Admitting: Obstetrics & Gynecology

## 2014-10-30 ENCOUNTER — Encounter (HOSPITAL_COMMUNITY): Payer: Self-pay | Admitting: General Practice

## 2014-10-30 DIAGNOSIS — O4100X0 Oligohydramnios, unspecified trimester, not applicable or unspecified: Secondary | ICD-10-CM | POA: Diagnosis present

## 2014-10-30 DIAGNOSIS — Z8249 Family history of ischemic heart disease and other diseases of the circulatory system: Secondary | ICD-10-CM | POA: Diagnosis not present

## 2014-10-30 DIAGNOSIS — O480 Post-term pregnancy: Secondary | ICD-10-CM | POA: Diagnosis present

## 2014-10-30 DIAGNOSIS — O99824 Streptococcus B carrier state complicating childbirth: Secondary | ICD-10-CM | POA: Diagnosis present

## 2014-10-30 DIAGNOSIS — G43109 Migraine with aura, not intractable, without status migrainosus: Secondary | ICD-10-CM

## 2014-10-30 DIAGNOSIS — Z3A41 41 weeks gestation of pregnancy: Secondary | ICD-10-CM | POA: Diagnosis present

## 2014-10-30 DIAGNOSIS — Z809 Family history of malignant neoplasm, unspecified: Secondary | ICD-10-CM | POA: Diagnosis not present

## 2014-10-30 DIAGNOSIS — Z3403 Encounter for supervision of normal first pregnancy, third trimester: Secondary | ICD-10-CM

## 2014-10-30 DIAGNOSIS — O4103X0 Oligohydramnios, third trimester, not applicable or unspecified: Secondary | ICD-10-CM | POA: Diagnosis present

## 2014-10-30 DIAGNOSIS — O09513 Supervision of elderly primigravida, third trimester: Secondary | ICD-10-CM | POA: Diagnosis not present

## 2014-10-30 DIAGNOSIS — Z3493 Encounter for supervision of normal pregnancy, unspecified, third trimester: Secondary | ICD-10-CM

## 2014-10-30 DIAGNOSIS — Z880 Allergy status to penicillin: Secondary | ICD-10-CM | POA: Diagnosis not present

## 2014-10-30 DIAGNOSIS — Z833 Family history of diabetes mellitus: Secondary | ICD-10-CM | POA: Diagnosis not present

## 2014-10-30 DIAGNOSIS — Z8759 Personal history of other complications of pregnancy, childbirth and the puerperium: Secondary | ICD-10-CM

## 2014-10-30 HISTORY — DX: Nausea with vomiting, unspecified: R11.2

## 2014-10-30 HISTORY — DX: Other specified postprocedural states: Z98.890

## 2014-10-30 LAB — CBC
HCT: 42.7 % (ref 36.0–46.0)
HEMOGLOBIN: 14.5 g/dL (ref 12.0–15.0)
MCH: 30.9 pg (ref 26.0–34.0)
MCHC: 34 g/dL (ref 30.0–36.0)
MCV: 91 fL (ref 78.0–100.0)
Platelets: 187 10*3/uL (ref 150–400)
RBC: 4.69 MIL/uL (ref 3.87–5.11)
RDW: 12.9 % (ref 11.5–15.5)
WBC: 9 10*3/uL (ref 4.0–10.5)

## 2014-10-30 LAB — OB RESULTS CONSOLE GBS: GBS: POSITIVE

## 2014-10-30 LAB — OB RESULTS CONSOLE GC/CHLAMYDIA
Chlamydia: NEGATIVE
Gonorrhea: NEGATIVE

## 2014-10-30 LAB — TYPE AND SCREEN
ABO/RH(D): O POS
ANTIBODY SCREEN: NEGATIVE

## 2014-10-30 LAB — ABO/RH: ABO/RH(D): O POS

## 2014-10-30 MED ORDER — OXYTOCIN BOLUS FROM INFUSION: 500.0000 mL | INTRAVENOUS | Status: DC

## 2014-10-30 MED ORDER — ONDANSETRON HCL 4 MG/2ML IJ SOLN
4.0000 mg | Freq: Four times a day (QID) | INTRAMUSCULAR | Status: DC | PRN
  Administered 2014-10-31 (×2): 4 mg via INTRAVENOUS
  Filled 2014-10-30 (×2): qty 2

## 2014-10-30 MED ORDER — MISOPROSTOL 25 MCG QUARTER TABLET
25.0000 ug | ORAL_TABLET | ORAL | Status: DC | PRN
  Administered 2014-10-30: 25 ug via VAGINAL
  Filled 2014-10-30: qty 1
  Filled 2014-10-30: qty 0.25

## 2014-10-30 MED ORDER — FENTANYL CITRATE 0.05 MG/ML IJ SOLN
100.0000 ug | INTRAMUSCULAR | Status: DC | PRN
  Administered 2014-10-31: 100 ug via INTRAVENOUS
  Filled 2014-10-30: qty 2

## 2014-10-30 MED ORDER — ACETAMINOPHEN 325 MG PO TABS: 650.0000 mg | ORAL_TABLET | ORAL | Status: DC | PRN

## 2014-10-30 MED ORDER — SODIUM CHLORIDE 0.9 % IV SOLN
2.0000 g | Freq: Once | INTRAVENOUS | Status: AC
  Administered 2014-10-30: 2 g via INTRAVENOUS
  Filled 2014-10-30: qty 2000

## 2014-10-30 MED ORDER — TERBUTALINE SULFATE 1 MG/ML IJ SOLN: 0.2500 mg | Freq: Once | INTRAMUSCULAR | Status: AC | PRN

## 2014-10-30 MED ORDER — CITRIC ACID-SODIUM CITRATE 334-500 MG/5ML PO SOLN
30.0000 mL | ORAL | Status: DC | PRN
  Administered 2014-10-30: 30 mL via ORAL
  Filled 2014-10-30 (×3): qty 15

## 2014-10-30 MED ORDER — LIDOCAINE HCL (PF) 1 % IJ SOLN
30.0000 mL | INTRAMUSCULAR | Status: DC | PRN
  Filled 2014-10-30: qty 30

## 2014-10-30 MED ORDER — FLEET ENEMA 7-19 GM/118ML RE ENEM: 1.0000 | ENEMA | Freq: Every day | RECTAL | Status: DC | PRN

## 2014-10-30 MED ORDER — OXYTOCIN 40 UNITS IN LACTATED RINGERS INFUSION - SIMPLE MED
62.5000 mL/h | INTRAVENOUS | Status: DC
  Administered 2014-10-31: 999 mL/h via INTRAVENOUS
  Filled 2014-10-30: qty 1000

## 2014-10-30 MED ORDER — OXYCODONE-ACETAMINOPHEN 5-325 MG PO TABS: 2.0000 | ORAL_TABLET | ORAL | Status: DC | PRN

## 2014-10-30 MED ORDER — SODIUM CHLORIDE 0.9 % IV SOLN
2.0000 g | Freq: Once | INTRAVENOUS | Status: DC
  Filled 2014-10-30: qty 2000

## 2014-10-30 MED ORDER — SODIUM CHLORIDE 0.9 % IV SOLN
1.0000 g | INTRAVENOUS | Status: DC
  Administered 2014-10-31 (×3): 1 g via INTRAVENOUS
  Filled 2014-10-30 (×8): qty 1000

## 2014-10-30 MED ORDER — LACTATED RINGERS IV SOLN
INTRAVENOUS | Status: DC
  Administered 2014-10-30 – 2014-10-31 (×4): via INTRAVENOUS

## 2014-10-30 MED ORDER — OXYCODONE-ACETAMINOPHEN 5-325 MG PO TABS: 1.0000 | ORAL_TABLET | ORAL | Status: DC | PRN

## 2014-10-30 MED ORDER — LACTATED RINGERS IV SOLN: 500.0000 mL | INTRAVENOUS | Status: DC | PRN

## 2014-10-30 NOTE — Progress Notes (Signed)
   Leighana Wylene SimmerSpraw Doyle is a 33 y.o. G1P0 at 3974w2d  admitted for induction of labor due to Low amniotic fluid..  Subjective: Contractions are very intense.  SROM about an hour ago with copious amount of clear fluid  Objective: Filed Vitals:   10/30/14 1351 10/30/14 1605  BP: 126/76 122/57  Pulse: 123 99  Temp: 98.6 F (37 C)   TempSrc: Oral   Resp: 18 18  Height: 5\' 6"  (1.676 m)   Weight: 82.101 kg (181 lb)       FHT:  FHR: 140 bpm, variability: moderate,  accelerations:  Present,  decelerations:  Absent UC:   regular, every 2-3 minutes SVE:  2/90/-2   Labs: Lab Results  Component Value Date   WBC 9.0 10/30/2014   HGB 14.5 10/30/2014   HCT 42.7 10/30/2014   MCV 91.0 10/30/2014   PLT 187 10/30/2014    Assessment / Plan: IOL for oligohydramnios, progressing well off of 2 cytotecs  Will start GBS prophylaxis May get in tub  Labor: Progressing normally Fetal Wellbeing:  Category I Pain Control:  Labor support without medications Anticipated MOD:  NSVD  [name],Tammala Weider 10/30/2014, 8:21 PM

## 2014-10-30 NOTE — Progress Notes (Signed)
   [name] is a 33 y.o. G1P0 at 6320w2d  admitted for induction of labor due to Low amniotic fluid..  Subjective: Contractions are still very painful.  Being in the water helps  Objective: Filed Vitals:   10/30/14 2117 10/30/14 2125 10/30/14 2128 10/30/14 2130  BP: 143/87  112/54 121/71  Pulse: 113 94 112 95  Temp:      TempSrc:      Resp:      Height:      Weight:      SpO2:  100%        FHT:  FHR: 136 bpm, variability: ascultated,  accelerations:  Present,  decelerations:  Absent UC:   regular, every 3 minutes SVE:  deferred  Labs: Lab Results  Component Value Date   WBC 9.0 10/30/2014   HGB 14.5 10/30/2014   HCT 42.7 10/30/2014   MCV 91.0 10/30/2014   PLT 187 10/30/2014    Assessment / Plan: IOL for oligohydramnios, progressing well without pitocin  Labor: Progressing normally Fetal Wellbeing:  Category I Pain Control:  Labor support without medications Anticipated MOD:  NSVD  [name],Jessina Marse 10/30/2014, 11:05 PM

## 2014-10-30 NOTE — Progress Notes (Signed)
Routine visit. Good FM. No problems. AFI today less than 5. I discussed the recommendation of IOL. She and her husband are in agreement. Dr. Debroah LoopArnold aware.

## 2014-10-30 NOTE — H&P (Signed)
[name] is a 33 y.o. G1P0 at 8266w2d by LMP/8 week US presenting for IOL for Oligo Diagnosed at [facility]. Cervix 0.5/20/-3 per Dr. Marice Potterove. Planning waterbirth. Went to class. Consent on chart.   [x]  Waterbirth class [x]  consent [facility]  Dating LMP  Genetic Screen NIPS: 1746 XY (InformaSeq)  Anatomic US Normal except for isolated LVEIF, does not need followup  GTT  88  TDaP vaccine  9/15 and Flu vaccine also  GBS Pos  Contraception Condoms  Baby Food Breast  Circumcision Undecided  [facility]ian [facility]  Support Person Husband   Patient Active Problem List   Diagnosis Date Noted  . Oligohydramnios antepartum 10/30/2014  . Migraine with aura 06/04/2014  . Supervision of normal pregnancy 05/07/2014  . Derangement of lateral meniscus 12/21/2011  . Arthralgia of lower leg 11/20/2011  . Allergic rhinitis 10/13/2010   History OB History    Gravida Para Term Preterm AB TAB SAB Ectopic Multiple Living   1              Past Medical History  Diagnosis Date  . PONV (postoperative nausea and vomiting)    Past Surgical History  Procedure Laterality Date  . Knee arthroscopy Right 12/2011    Microfracture  . Knee arthroscopy Left 08/2008  . Tonsillectomy and adenoidectomy     Family History: family history includes Cancer in her maternal grandfather, mother, and paternal grandmother; Diabetes in her sister; Heart disease in her paternal grandfather; Hypertension in her father. Social History:  reports that she has never smoked. She has never used smokeless tobacco. She reports that she does not drink alcohol or use illicit drugs.   Prenatal Transfer Tool  Maternal Diabetes: No Genetic Screening: Normal Maternal Ultrasounds/Referrals: Abnormal:  Findings:   Isolated EIF (echogenic intracardiac focus) Fetal Ultrasounds or other Referrals:  None Maternal Substance Abuse:  No Significant Maternal Medications:  None Significant  Maternal Lab Results:  Lab values include: Group B Strep positive Other Comments:  None  Review of Systems  Constitutional: Negative for fever and chills.  Eyes: Negative for blurred vision.  Gastrointestinal: Positive for abdominal pain. Diarrhea: mild contractions.  Neurological: Negative for headaches.    Dilation: Fingertip Effacement (%): Thick Station: -3 Exam by:: Laverta BaltimoreRenee Kendrick RNC [id number] Blood pressure 122/57, pulse 99, temperature 98.6 F (37 C), temperature source Oral, resp. rate 18, height 5\' 6"  (1.676 m), weight 82.101 kg (181 lb), last menstrual period 01/14/2014. Maternal Exam:  Uterine Assessment: Contraction strength is mild.  Contraction frequency is irregular.   Abdomen: Estimated fetal weight is 8 lb.   Fetal presentation: vertex  Pelvis: adequate for delivery.   Cervix: Cervix evaluated by digital exam.     Fetal Exam Fetal Monitor Review: Mode: ultrasound.   Baseline rate: 130.  Variability: moderate (6-25 bpm).   Pattern: accelerations present and no decelerations.    Fetal State Assessment: Category I - tracings are normal.     Physical Exam  Nursing note and vitals reviewed. Constitutional: She is oriented to person, place, and time. She appears well-developed and well-nourished. No distress.  HENT:  Head: Normocephalic.  Eyes: Conjunctivae are normal.  Cardiovascular: Normal rate and regular rhythm.   Respiratory: Effort normal and breath sounds normal.  GI: Soft. There is no tenderness.  Musculoskeletal: Normal range of motion. She exhibits no edema or tenderness.  Neurological: She is alert and oriented to person, place, and time. She has normal reflexes.  Skin: Skin  is warm and dry.  Psychiatric: She has a normal mood and affect.    Prenatal labs: ABO, Rh: --/--/O POS (12/29 1505) Antibody: NEG (12/29 1505) Rubella:  Immune RPR: NON REAC (09/30 0930)  HBsAg: Negative (05/08 0000)  HIV: NONREACTIVE (09/30 0930)  GBS: Positive  (12/29 1336)  NIPS: negative 1 hour GTT 88  Assessment: 1. Labor: IOL for Oligo 2. Fetal Wellbeing: Category I  3. Pain Control: None 4. GBS: Pos 5. 41.2 week IUP  Plan:  1. Admit to BS per consult with MD 2. Routine L&D orders 3. Analgesia/anesthesia PRN  4. Lengthy conversation w/ pt and FOB about pts Hx of ABX allergies. Told that she had rash from PCN as a child. Has taken Amox as an adult w/ no reaction. N/V from PO cephalosporins. Pt feels comfortable taking Ampicillin. Will watch closely for reaction and switch to Vanc PRN.   5. Also discuss challenges of allowing waterbirth for pt w/ indications and intervention needed for IOL. Discussed small possibility that IOL can be performed w/out pitocin or that pitocin could be turned off later in labor and see if pt conintues to progress. Pt and FOB verbalize understanding that Waterbirth cannot be undertaken w/: 1. Pitocin infusing 2. Need for continuous monitoring or 3. Non reassuring fetal status.    Dorathy KinsmanSMITH, Shikara Mcauliffe 10/30/2014, 4:37 PM

## 2014-10-31 ENCOUNTER — Inpatient Hospital Stay (HOSPITAL_COMMUNITY): Payer: BC Managed Care – PPO | Admitting: Anesthesiology

## 2014-10-31 ENCOUNTER — Encounter (HOSPITAL_COMMUNITY): Payer: Self-pay | Admitting: *Deleted

## 2014-10-31 DIAGNOSIS — O480 Post-term pregnancy: Secondary | ICD-10-CM

## 2014-10-31 DIAGNOSIS — O4103X0 Oligohydramnios, third trimester, not applicable or unspecified: Secondary | ICD-10-CM

## 2014-10-31 DIAGNOSIS — Z3A41 41 weeks gestation of pregnancy: Secondary | ICD-10-CM

## 2014-10-31 DIAGNOSIS — O4100X0 Oligohydramnios, unspecified trimester, not applicable or unspecified: Secondary | ICD-10-CM | POA: Diagnosis present

## 2014-10-31 LAB — RPR

## 2014-10-31 LAB — HIV ANTIBODY (ROUTINE TESTING W REFLEX): HIV 1&2 Ab, 4th Generation: NONREACTIVE

## 2014-10-31 MED ORDER — SIMETHICONE 80 MG PO CHEW: 80.0000 mg | CHEWABLE_TABLET | ORAL | Status: DC | PRN

## 2014-10-31 MED ORDER — OXYCODONE-ACETAMINOPHEN 5-325 MG PO TABS
1.0000 | ORAL_TABLET | ORAL | Status: DC | PRN
  Filled 2014-10-31: qty 1

## 2014-10-31 MED ORDER — ONDANSETRON HCL 4 MG/2ML IJ SOLN: 4.0000 mg | INTRAMUSCULAR | Status: DC | PRN

## 2014-10-31 MED ORDER — DIPHENHYDRAMINE HCL 25 MG PO CAPS: 25.0000 mg | ORAL_CAPSULE | Freq: Four times a day (QID) | ORAL | Status: DC | PRN

## 2014-10-31 MED ORDER — ONDANSETRON HCL 4 MG PO TABS: 4.0000 mg | ORAL_TABLET | ORAL | Status: DC | PRN

## 2014-10-31 MED ORDER — EPHEDRINE 5 MG/ML INJ
10.0000 mg | INTRAVENOUS | Status: DC | PRN
  Filled 2014-10-31: qty 2

## 2014-10-31 MED ORDER — SENNOSIDES-DOCUSATE SODIUM 8.6-50 MG PO TABS
2.0000 | ORAL_TABLET | ORAL | Status: DC
  Administered 2014-11-01 (×2): 2 via ORAL
  Filled 2014-10-31 (×2): qty 2

## 2014-10-31 MED ORDER — EPHEDRINE 5 MG/ML INJ
10.0000 mg | INTRAVENOUS | Status: AC | PRN
  Administered 2014-10-31 (×2): 10 mg via INTRAVENOUS

## 2014-10-31 MED ORDER — PRENATAL MULTIVITAMIN CH: 1.0000 | ORAL_TABLET | Freq: Every day | ORAL | Status: DC

## 2014-10-31 MED ORDER — WITCH HAZEL-GLYCERIN EX PADS: 1.0000 "application " | MEDICATED_PAD | CUTANEOUS | Status: DC | PRN

## 2014-10-31 MED ORDER — SODIUM CHLORIDE 0.9 % IJ SOLN: 3.0000 mL | Freq: Two times a day (BID) | INTRAMUSCULAR | Status: DC

## 2014-10-31 MED ORDER — IBUPROFEN 600 MG PO TABS: 600.0000 mg | ORAL_TABLET | Freq: Four times a day (QID) | ORAL | Status: DC

## 2014-10-31 MED ORDER — FENTANYL 2.5 MCG/ML BUPIVACAINE 1/10 % EPIDURAL INFUSION (WH - ANES)
14.0000 mL/h | INTRAMUSCULAR | Status: DC | PRN
  Administered 2014-10-31 (×2): 14 mL/h via EPIDURAL
  Filled 2014-10-31 (×2): qty 125

## 2014-10-31 MED ORDER — LACTATED RINGERS IV SOLN
500.0000 mL | Freq: Once | INTRAVENOUS | Status: AC
  Administered 2014-10-31: 500 mL via INTRAVENOUS

## 2014-10-31 MED ORDER — KETOROLAC TROMETHAMINE 30 MG/ML IJ SOLN
30.0000 mg | Freq: Once | INTRAMUSCULAR | Status: AC
  Administered 2014-10-31: 30 mg via INTRAVENOUS
  Filled 2014-10-31: qty 1

## 2014-10-31 MED ORDER — FENTANYL 2.5 MCG/ML BUPIVACAINE 1/10 % EPIDURAL INFUSION (WH - ANES)
14.0000 mL/h | INTRAMUSCULAR | Status: DC | PRN
Start: 2014-10-31 — End: 2014-10-31

## 2014-10-31 MED ORDER — OXYTOCIN 40 UNITS IN LACTATED RINGERS INFUSION - SIMPLE MED: 62.5000 mL/h | INTRAVENOUS | Status: DC | PRN

## 2014-10-31 MED ORDER — DIBUCAINE 1 % RE OINT
1.0000 "application " | TOPICAL_OINTMENT | RECTAL | Status: DC | PRN
  Filled 2014-10-31: qty 28

## 2014-10-31 MED ORDER — MEASLES, MUMPS & RUBELLA VAC ~~LOC~~ INJ
0.5000 mL | INJECTION | Freq: Once | SUBCUTANEOUS | Status: DC
  Filled 2014-10-31: qty 0.5

## 2014-10-31 MED ORDER — KETOROLAC TROMETHAMINE 30 MG/ML IJ SOLN: 30.0000 mg | Freq: Four times a day (QID) | INTRAMUSCULAR | Status: DC | PRN

## 2014-10-31 MED ORDER — OXYTOCIN 40 UNITS IN LACTATED RINGERS INFUSION - SIMPLE MED
1.0000 m[IU]/min | INTRAVENOUS | Status: DC
  Administered 2014-10-31: 2 m[IU]/min via INTRAVENOUS

## 2014-10-31 MED ORDER — BISACODYL 10 MG RE SUPP: 10.0000 mg | Freq: Every day | RECTAL | Status: DC | PRN

## 2014-10-31 MED ORDER — METOCLOPRAMIDE HCL 5 MG/ML IJ SOLN
10.0000 mg | Freq: Once | INTRAMUSCULAR | Status: AC
  Administered 2014-10-31: 10 mg via INTRAVENOUS
  Filled 2014-10-31: qty 2

## 2014-10-31 MED ORDER — SODIUM CHLORIDE 0.9 % IJ SOLN: 3.0000 mL | INTRAMUSCULAR | Status: DC | PRN

## 2014-10-31 MED ORDER — ZOLPIDEM TARTRATE 5 MG PO TABS: 5.0000 mg | ORAL_TABLET | Freq: Every evening | ORAL | Status: DC | PRN

## 2014-10-31 MED ORDER — LANOLIN HYDROUS EX OINT: TOPICAL_OINTMENT | CUTANEOUS | Status: DC | PRN

## 2014-10-31 MED ORDER — SENNOSIDES-DOCUSATE SODIUM 8.6-50 MG PO TABS: 2.0000 | ORAL_TABLET | ORAL | Status: DC

## 2014-10-31 MED ORDER — TETANUS-DIPHTH-ACELL PERTUSSIS 5-2.5-18.5 LF-MCG/0.5 IM SUSP
0.5000 mL | Freq: Once | INTRAMUSCULAR | Status: DC
  Filled 2014-10-31: qty 0.5

## 2014-10-31 MED ORDER — DIBUCAINE 1 % RE OINT: 1.0000 "application " | TOPICAL_OINTMENT | RECTAL | Status: DC | PRN

## 2014-10-31 MED ORDER — DIPHENHYDRAMINE HCL 50 MG/ML IJ SOLN: 12.5000 mg | INTRAMUSCULAR | Status: DC | PRN

## 2014-10-31 MED ORDER — OXYCODONE-ACETAMINOPHEN 5-325 MG PO TABS: 2.0000 | ORAL_TABLET | ORAL | Status: DC | PRN

## 2014-10-31 MED ORDER — BENZOCAINE-MENTHOL 20-0.5 % EX AERO
1.0000 "application " | INHALATION_SPRAY | CUTANEOUS | Status: DC | PRN
  Filled 2014-10-31: qty 56

## 2014-10-31 MED ORDER — PRENATAL MULTIVITAMIN CH
1.0000 | ORAL_TABLET | Freq: Every day | ORAL | Status: DC
  Filled 2014-10-31 (×2): qty 1

## 2014-10-31 MED ORDER — IBUPROFEN 600 MG PO TABS
600.0000 mg | ORAL_TABLET | Freq: Four times a day (QID) | ORAL | Status: DC
  Administered 2014-10-31 – 2014-11-02 (×8): 600 mg via ORAL
  Filled 2014-10-31 (×8): qty 1

## 2014-10-31 MED ORDER — SODIUM CHLORIDE 0.9 % IV SOLN: 250.0000 mL | INTRAVENOUS | Status: DC | PRN

## 2014-10-31 MED ORDER — PHENYLEPHRINE 40 MCG/ML (10ML) SYRINGE FOR IV PUSH (FOR BLOOD PRESSURE SUPPORT)
80.0000 ug | PREFILLED_SYRINGE | INTRAVENOUS | Status: DC | PRN
Start: 2014-10-31 — End: 2014-10-31
  Filled 2014-10-31: qty 10
  Filled 2014-10-31: qty 2

## 2014-10-31 MED ORDER — OXYCODONE-ACETAMINOPHEN 5-325 MG PO TABS
1.0000 | ORAL_TABLET | ORAL | Status: DC | PRN
  Administered 2014-10-31: 1 via ORAL

## 2014-10-31 MED ORDER — TERBUTALINE SULFATE 1 MG/ML IJ SOLN: 0.2500 mg | Freq: Once | INTRAMUSCULAR | Status: DC | PRN

## 2014-10-31 MED ORDER — PHENYLEPHRINE 40 MCG/ML (10ML) SYRINGE FOR IV PUSH (FOR BLOOD PRESSURE SUPPORT)
80.0000 ug | PREFILLED_SYRINGE | INTRAVENOUS | Status: AC | PRN
  Administered 2014-10-31 (×3): 80 ug via INTRAVENOUS
  Filled 2014-10-31: qty 10

## 2014-10-31 MED ORDER — FLEET ENEMA 7-19 GM/118ML RE ENEM: 1.0000 | ENEMA | Freq: Every day | RECTAL | Status: DC | PRN

## 2014-10-31 MED ORDER — LIDOCAINE HCL (PF) 1 % IJ SOLN
INTRAMUSCULAR | Status: DC | PRN
  Administered 2014-10-31: 6 mL
  Administered 2014-10-31: 4 mL

## 2014-10-31 NOTE — Anesthesia Procedure Notes (Signed)

## 2014-10-31 NOTE — Anesthesia Preprocedure Evaluation (Signed)

## 2014-10-31 NOTE — Progress Notes (Addendum)
   Marcene Wylene SimmerSpraw Doyle is a 33 y.o. G1P0 at 3422w3d  admitted for induction of labor due to Low amniotic fluid..  Subjective:  No Urge to push but wants to opush Objective: Filed Vitals:   10/31/14 0516 10/31/14 0517 10/31/14 0521 10/31/14 0532  BP:    111/61  Pulse: 95 95 92 99  Temp:      TempSrc:      Resp:      Height:      Weight:      SpO2:          FHT:  FHR: 140 bpm, variability: moderate,  accelerations:  Present,  decelerations:  Present occ mild variable UC:   regular, every 3 minutes SVE:   Dilation: 10 Effacement (%): 100 Station: +1, +2 Exam by:: L.Stubbs, RN  Was Completely dilated around 0400.  Pushed for about 30 minutes, but wasn't really feeling the urge, so she labored down for a bout 1.5 hours.  Pushing now  Labs: Lab Results  Component Value Date   WBC 9.0 10/30/2014   HGB 14.5 10/30/2014   HCT 42.7 10/30/2014   MCV 91.0 10/30/2014   PLT 187 10/30/2014    Assessment / Plan: 2nd stage labor, progressing  Labor: Progressing normally Fetal Wellbeing:  Category I Pain Control:  Epidural Anticipated MOD:  NSVD  [name],Trayon Krantz 10/31/2014, 6:18 AM

## 2014-10-31 NOTE — Progress Notes (Signed)
   [name] is a 33 y.o. G1P0 at 7393w3d  admitted for induction of labor due to Low amniotic fluid..  Subjective: Opted for epidural.  Now comfortable  Objective: Filed Vitals:   10/31/14 0220 10/31/14 0223 10/31/14 0225 10/31/14 0227  BP:  81/54  88/37  Pulse: 94 97 79 78  Temp:      TempSrc:      Resp:    16  Height:      Weight:      SpO2: 100%  100%       FHT:  FHR: 150 bpm, variability: moderate,  accelerations:  Present,  decelerations:  Present prolonged decels 2/2 hypotension after epidural, resolved UC:   regular, every 2-3 minutes SVE:   Dilation: 8 Effacement (%): 100 Station: 0 Exam by:: L.Stubbs, RN   Labs: Lab Results  Component Value Date   WBC 9.0 10/30/2014   HGB 14.5 10/30/2014   HCT 42.7 10/30/2014   MCV 91.0 10/30/2014   PLT 187 10/30/2014    Assessment / Plan: IOL with cytotec, progressing well  Labor: Progressing normally Fetal Wellbeing:  Category I and Category II Pain Control:  Epidural Anticipated MOD:  NSVD  ShannonShannon Green 10/31/2014, 2:30 AM

## 2014-10-31 NOTE — Progress Notes (Signed)
[name] is a 33 y.o. G1P0 at 3253w3d by admitted for oligo and induction.   Subjective: Pt has no pain and no urge to push.  She has been FD since 4 am.  She labored down for 2 hours and then pushed for 1 hour with little effectiveness.  Pt can not feel any pressure on her perineum.  Pt refused her epidural to be turned down on multiple occasions.  She understands that she has been FD for 5 hours and thre are risks of bleeding, pelvic organ dammage and c/s if she does not deliver in a timely fashion.  Pt agreed to pitocin at 8:20 a.m.  Her contraction pattern has increased from q6 to q3 minutes over the past hour on pitocin.  Pt still has no urge or pressure.    Objective: BP 109/57 mmHg  Pulse 85  Temp(Src) 98.3 F (36.8 C) (Oral)  Resp 18  Ht 5\' 6"  (1.676 m)  Wt 181 lb (82.101 kg)  BMI 29.23 kg/m2  SpO2 99%  LMP 01/14/2014      FHT:  FHR: 140 bpm, variability: moderate,  accelerations:  Present,  decelerations:  Absent UC:   regular, every 3 minutes SVE:   Dilation: 10 Effacement (%): 100 Station: +2 Exam by:: Dr Penne LashLeggett   Labs: Lab Results  Component Value Date   WBC 9.0 10/30/2014   HGB 14.5 10/30/2014   HCT 42.7 10/30/2014   MCV 91.0 10/30/2014   PLT 187 10/30/2014    Assessment / Plan: FD +2 station and beginning to push.  Pelvis feels adequate and infant EFW 7-8 pounds.   Still anticipate NSVD.  Fetal heart rate is category 1.  Will hopefully make good progress.  Will monitor closely.     Dorse Locy H. 10/31/2014, 9:36 AM

## 2014-10-31 NOTE — Progress Notes (Signed)
Patient ID: [name], female   DOB: 1981/07/18, 33 y.o.   MRN: [id number]  Pt pushing for 1 hour with progress  Station is +2 to +3.  Pt still does not have any feeling any pressure on perineum.  Pt agrees to turn down the epidural to hopefully improve sensation and continue pushing.  Pt understands she is might need a vacuum or c/s in near future if more progress is not made.  FHt reassuring with mild variable with pushing.

## 2014-10-31 NOTE — Lactation Note (Signed)
This note was copied from the chart of [name]. Lactation Consultation Note Initial visit at 7 hours of age.  Mom reports a few good feedings and holding baby STS now after a feeding attempt.  [facility] resources given and discussed.  Encouraged to feed with early cues on demand.  Early newborn behavior discussed.  Hand expression demonstrated with small drop of colostrum visible.  Mom to call for assist as needed.     Patient Name: [name] WGNFA'OToday's Date: 10/31/2014 Reason for consult: Initial assessment   Maternal Data Has patient been taught Hand Expression?: Yes Does the patient have breastfeeding experience prior to this delivery?: No  Feeding Feeding Type: Breast Fed Length of feed: 0 min (off/on; STS)  LATCH Score/Interventions                Intervention(s): Breastfeeding basics reviewed     Lactation Tools Discussed/Used     Consult Status Consult Status: Follow-up Date: 11/01/14 Follow-up type: In-patient    Jannifer RodneyShoptaw, Vester Balthazor Lynn 10/31/2014, 7:28 PM

## 2014-11-01 ENCOUNTER — Inpatient Hospital Stay (HOSPITAL_COMMUNITY): Admission: RE | Admit: 2014-11-01 | Payer: BC Managed Care – PPO | Source: Ambulatory Visit

## 2014-11-01 MED ORDER — HYDROCODONE-ACETAMINOPHEN 5-325 MG PO TABS
1.0000 | ORAL_TABLET | ORAL | Status: DC | PRN
  Administered 2014-11-02: 1 via ORAL
  Filled 2014-11-01: qty 1

## 2014-11-01 MED ORDER — ONDANSETRON HCL 4 MG/2ML IJ SOLN
4.0000 mg | Freq: Once | INTRAMUSCULAR | Status: AC
  Administered 2014-11-01: 4 mg via INTRAVENOUS
  Filled 2014-11-01: qty 2

## 2014-11-01 MED ORDER — ONDANSETRON 8 MG/NS 50 ML IVPB
8.0000 mg | Freq: Once | INTRAVENOUS | Status: DC
  Filled 2014-11-01: qty 8

## 2014-11-01 MED ORDER — FENTANYL CITRATE 0.05 MG/ML IJ SOLN
100.0000 ug | INTRAMUSCULAR | Status: DC | PRN
  Administered 2014-11-01: 100 ug via INTRAVENOUS
  Filled 2014-11-01: qty 2

## 2014-11-01 NOTE — Progress Notes (Signed)
Post Partum Day 1 Subjective:  [name] is a 33 y.o. G1P1001 4423w3d s/p VAVD with extensive sulcal laceration still with vaginal packing and foley.  No acute events overnight.   Pain is moderately controlled.   Method of Feeding: breast  Objective: Blood pressure 97/60, pulse 88, temperature 98.1 F (36.7 C), temperature source Oral, resp. rate 18, height 5\' 6"  (1.676 m), weight 181 lb (82.101 kg), last menstrual period 01/14/2014, SpO2 100 %, unknown if currently breastfeeding.  Physical Exam:  General: alert, cooperative and no distress Lochia:normal flow Chest: CTAB Heart: RRR no m/r/g Abdomen: +BS, soft, nontender,  Uterine Fundus: firm DVT Evaluation: No evidence of DVT seen on physical exam. Extremities: no edema   Recent Labs  10/30/14 1505  HGB 14.5  HCT 42.7    Assessment/Plan:  ASSESSMENT: [name] is a 33 y.o. G1P1001 6323w3d s/p VAVD with sulcal lacs, still with foley and vaginal packing - remove vaginal packing and foley; fentanyl and zofran ordered for pain control. - d/c percocets, trial of zofran  Plan for discharge tomorrow   LOS: 2 days   [name] 11/01/2014, 11:01 AM

## 2014-11-01 NOTE — Anesthesia Postprocedure Evaluation (Signed)
  Anesthesia Post-op Note  Patient: [name]  Procedure(s) Performed: * No procedures listed *  Patient Location: PACU and Mother/Baby  Anesthesia Type:Epidural  Level of Consciousness: awake, alert , oriented and patient cooperative  Airway and Oxygen Therapy: Patient Spontanous Breathing  Post-op Pain: none  Post-op Assessment: Post-op Vital signs reviewed, Patient's Cardiovascular Status Stable, Respiratory Function Stable, Patent Airway, No signs of Nausea or vomiting, Adequate PO intake, Pain level controlled, No headache, No backache, No residual numbness and No residual motor weakness  Post-op Vital Signs: Reviewed and stable  Last Vitals:  Filed Vitals:   11/01/14 0200  BP: 97/60  Pulse: 88  Temp: 36.7 C  Resp: 18    Complications: No apparent anesthesia complications

## 2014-11-02 MED ORDER — HYDROCODONE-ACETAMINOPHEN 5-325 MG PO TABS: 1.0000 | ORAL_TABLET | ORAL | Status: DC | PRN

## 2014-11-02 NOTE — Lactation Note (Signed)
This note was copied from the chart of Shannon Lunette Tapp. Lactation Consultation Note: Follow up visit with mom. She has baby latched to the breast when I went in. Reports baby has been feeding for 15 min. Getting sleepy at the breast. Reports baby fed a lot through the night. Reassurance given. Has some bruising noted on both areolas. Thinks baby wasn't latched right at earlier feedings. Encouraged to rub EBM into nipples after nursing. Discussed engorgement prevention and treatment. No questions at present. Reviewed OP appointments and BFSG as resources after DC. To call prn Patient Name: [name] [id number]'W Date: 11/02/2014 Reason for consult: Follow-up assessment   Maternal Data Formula Feeding for Exclusion: No Has patient been taught Hand Expression?: Yes Does the patient have breastfeeding experience prior to this delivery?: No  Feeding Feeding Type: Breast Fed Length of feed: 20 min  LATCH Score/Interventions Latch: Grasps breast easily, tongue down, lips flanged, rhythmical sucking.  Audible Swallowing: None  Type of Nipple: Everted at rest and after stimulation  Comfort (Breast/Nipple): Filling, red/small blisters or bruises, mild/mod discomfort  Problem noted: Mild/Moderate discomfort Interventions (Mild/moderate discomfort): Hand expression  Hold (Positioning): No assistance needed to correctly position infant at breast. Intervention(s): Breastfeeding basics reviewed  LATCH Score: 7  Lactation Tools Discussed/Used WIC Program: No   Consult Status Consult Status: Complete    Pamelia Hoit 11/02/2014, 9:39 AM

## 2014-11-02 NOTE — Discharge Instructions (Signed)

## 2014-11-02 NOTE — Discharge Summary (Signed)
  Obstetric Discharge Summary Reason for Admission: IOL 2/2 oligohydramnios Prenatal Procedures: NST Intrapartum Procedures: spontaneous vaginal delivery and vacuum Postpartum Procedures: none Complications-Operative and Postpartum: 2nd degree and sulcal. degree perineal laceration  Operative Delivery Note At 11:47 AM a viable female was delivered via Vaginal, Vacuum (Extractor).  Presentation: vertex; Position: Right occiput anterior; Station: +3.  Verbal consent: obtained from patient.  Risks and benefits discussed in detail.  Risks include, but are not limited to the risks of anesthesia, bleeding, infection, damage to maternal tissues, fetal cephalhematoma.  There is also the risk of inability to effect vaginal delivery of the head, or shoulder dystocia that cannot be resolved by established maneuvers, leading to the need for emergency cesarean section.  APGAR: 7, 9; weight  .   Placenta status: Intact, Spontaneous.   Cord: 3 vessels with the following complications: None.  Cord pH: 7.16 Double nuchal cord  Anesthesia: Epidural  Instruments: kiwi vacuum; 3 contractions total before delivery of head. Episiotomy: None Lacerations: 2nd degree;Perineal;Sulcus bilateral, minor labial lacerations bilateral Suture Repair: 2.0 vicryl adn 2.0 Monocryl Est. Blood Loss (mL):  550 (from lacerations, no uterine atony Tissue is very edematous and friable, small amount of oozing from minor abrasions.  Vaginal packing placed to help with hemostasis.  Foley to bladder overnight.  Toradol x1 for pain.  Narcotics as needed.  Mom to postpartum.  Baby to NICU.  LEGGETT,KELLY H. 10/31/2014, 12:54 PM     Hospital Course:  Active Problems:   Oligohydramnios antepartum   Oligohydramnios   [name] is a 34 y.o. G1P1001 s/p VAVD.  Patient presented to for IOL 2/2 oligo She has postpartum course that was uncomplicated including no problems with ambulating, PO intake, urination, pain, or  bleeding. The pt feels ready to go home and  will be discharged with outpatient follow-up.   Today: No acute events overnight.  Pt denies problems with ambulating, voiding or po intake.  Lochia Small.  Plan for birth control is  mirena.  Method of Feeding: breast   H/H: Lab Results  Component Value Date/Time   HGB 14.5 10/30/2014 03:05 PM   HGB 13.1 03/09/2014   HCT 42.7 10/30/2014 03:05 PM   HCT 38 03/09/2014    Discharge Diagnoses: Term Pregnancy-delivered  Discharge Information: Date: 11/02/2014 Activity: pelvic rest Diet: routine  Medications: did not tolerate percocets, will trial norco today and if tolerated will discharge with norco Breast feeding:  Yes Condition: stable Instructions: refer to handout Discharge to: home      Medication List    TAKE these medications        Butalbital-APAP-Caffeine 50-325-40 MG per capsule  Take 1-2 capsules by mouth every 6 (six) hours as needed for headache.     cetirizine 10 MG tablet  Commonly known as:  ZYRTEC  Take 10 mg by mouth daily.     prenatal vitamin w/FE, FA 27-1 MG Tabs tablet  Take 1 tablet by mouth daily at 12 noon.           Follow-up Information    Follow up with [facility] In 5 weeks.   Specialty:  Obstetrics and Gynecology   Contact information:   [address] [place] [address] [phone]      Perry Mount ,MD OB Fellow 11/02/2014,9:26 AM

## 2014-11-07 ENCOUNTER — Ambulatory Visit (INDEPENDENT_AMBULATORY_CARE_PROVIDER_SITE_OTHER): Payer: BLUE CROSS/BLUE SHIELD | Admitting: Obstetrics and Gynecology

## 2014-11-07 ENCOUNTER — Encounter (HOSPITAL_COMMUNITY): Payer: Self-pay | Admitting: General Practice

## 2014-11-07 ENCOUNTER — Encounter: Payer: Self-pay | Admitting: Obstetrics and Gynecology

## 2014-11-07 ENCOUNTER — Inpatient Hospital Stay (HOSPITAL_COMMUNITY)
Admission: AD | Admit: 2014-11-07 | Discharge: 2014-11-10 | DRG: 776 | Disposition: A | Discharge status: Home / Self Care | Payer: BLUE CROSS/BLUE SHIELD | Source: Ambulatory Visit | Attending: Family Medicine | Admitting: Family Medicine

## 2014-11-07 VITALS — BP 125/73 | HR 137 | Temp 101.0°F | Ht 66.0 in | Wt 168.6 lb

## 2014-11-07 DIAGNOSIS — Y9289 Other specified places as the place of occurrence of the external cause: Secondary | ICD-10-CM

## 2014-11-07 DIAGNOSIS — O864 Pyrexia of unknown origin following delivery: Secondary | ICD-10-CM | POA: Diagnosis present

## 2014-11-07 DIAGNOSIS — Z881 Allergy status to other antibiotic agents status: Secondary | ICD-10-CM

## 2014-11-07 DIAGNOSIS — Z880 Allergy status to penicillin: Secondary | ICD-10-CM | POA: Diagnosis not present

## 2014-11-07 DIAGNOSIS — O9122 Nonpurulent mastitis associated with the puerperium: Secondary | ICD-10-CM | POA: Diagnosis present

## 2014-11-07 DIAGNOSIS — Z888 Allergy status to other drugs, medicaments and biological substances status: Secondary | ICD-10-CM | POA: Diagnosis not present

## 2014-11-07 DIAGNOSIS — O8612 Endometritis following delivery: Secondary | ICD-10-CM

## 2014-11-07 DIAGNOSIS — O9089 Other complications of the puerperium, not elsewhere classified: Secondary | ICD-10-CM | POA: Diagnosis present

## 2014-11-07 DIAGNOSIS — N61 Mastitis without abscess: Secondary | ICD-10-CM

## 2014-11-07 DIAGNOSIS — K5900 Constipation, unspecified: Secondary | ICD-10-CM

## 2014-11-07 LAB — CBC WITH DIFFERENTIAL/PLATELET
BASOS ABS: 0 10*3/uL (ref 0.0–0.1)
Basophils Relative: 0 % (ref 0–1)
EOS PCT: 0 % (ref 0–5)
Eosinophils Absolute: 0 10*3/uL (ref 0.0–0.7)
HEMATOCRIT: 27.2 % — AB (ref 36.0–46.0)
Hemoglobin: 9.2 g/dL — ABNORMAL LOW (ref 12.0–15.0)
Lymphocytes Relative: 3 % — ABNORMAL LOW (ref 12–46)
Lymphs Abs: 0.5 10*3/uL — ABNORMAL LOW (ref 0.7–4.0)
MCH: 30.7 pg (ref 26.0–34.0)
MCHC: 33.8 g/dL (ref 30.0–36.0)
MCV: 90.7 fL (ref 78.0–100.0)
MONO ABS: 0.7 10*3/uL (ref 0.1–1.0)
Monocytes Relative: 4 % (ref 3–12)
Neutro Abs: 16.6 10*3/uL — ABNORMAL HIGH (ref 1.7–7.7)
Neutrophils Relative %: 93 % — ABNORMAL HIGH (ref 43–77)
Platelets: 333 10*3/uL (ref 150–400)
RBC: 3 MIL/uL — ABNORMAL LOW (ref 3.87–5.11)
RDW: 12.9 % (ref 11.5–15.5)
WBC: 17.8 10*3/uL — ABNORMAL HIGH (ref 4.0–10.5)

## 2014-11-07 LAB — COMPREHENSIVE METABOLIC PANEL
ALBUMIN: 3.1 g/dL — AB (ref 3.5–5.2)
ALK PHOS: 96 U/L (ref 39–117)
ALT: 24 U/L (ref 0–35)
AST: 22 U/L (ref 0–37)
Anion gap: 8 (ref 5–15)
BUN: 16 mg/dL (ref 6–23)
CHLORIDE: 109 meq/L (ref 96–112)
CO2: 22 mmol/L (ref 19–32)
CREATININE: 0.83 mg/dL (ref 0.50–1.10)
Calcium: 8.5 mg/dL (ref 8.4–10.5)
GFR calc non Af Amer: >90 mL/min (ref >90)
GLUCOSE: 100 mg/dL — AB (ref 70–99)
Potassium: 3.9 mmol/L (ref 3.5–5.1)
SODIUM: 139 mmol/L (ref 135–145)
Total Bilirubin: 0.3 mg/dL (ref 0.3–1.2)
Total Protein: 5.9 g/dL — ABNORMAL LOW (ref 6.0–8.3)

## 2014-11-07 MED ORDER — ALUM & MAG HYDROXIDE-SIMETH 200-200-20 MG/5ML PO SUSP
30.0000 mL | Freq: Four times a day (QID) | ORAL | Status: DC | PRN
  Administered 2014-11-09: 30 mL via ORAL
  Filled 2014-11-07: qty 30

## 2014-11-07 MED ORDER — ACETAMINOPHEN 325 MG PO TABS
650.0000 mg | ORAL_TABLET | Freq: Four times a day (QID) | ORAL | Status: DC | PRN
  Administered 2014-11-07 – 2014-11-09 (×3): 650 mg via ORAL
  Filled 2014-11-07 (×3): qty 2

## 2014-11-07 MED ORDER — ONDANSETRON HCL 4 MG PO TABS: 4.0000 mg | ORAL_TABLET | Freq: Four times a day (QID) | ORAL | Status: DC | PRN

## 2014-11-07 MED ORDER — IBUPROFEN 800 MG PO TABS
800.0000 mg | ORAL_TABLET | Freq: Three times a day (TID) | ORAL | Status: DC | PRN
  Administered 2014-11-07 – 2014-11-10 (×6): 800 mg via ORAL
  Filled 2014-11-07 (×6): qty 1

## 2014-11-07 MED ORDER — ONDANSETRON HCL 4 MG/2ML IJ SOLN: 4.0000 mg | Freq: Four times a day (QID) | INTRAMUSCULAR | Status: DC | PRN

## 2014-11-07 MED ORDER — ACETAMINOPHEN 650 MG RE SUPP
650.0000 mg | Freq: Four times a day (QID) | RECTAL | Status: DC | PRN
  Filled 2014-11-07: qty 1

## 2014-11-07 MED ORDER — IBUPROFEN 600 MG PO TABS: 600.0000 mg | ORAL_TABLET | Freq: Four times a day (QID) | ORAL | Status: DC | PRN

## 2014-11-07 MED ORDER — SODIUM CHLORIDE 0.9 % IV SOLN
INTRAVENOUS | Status: AC
  Administered 2014-11-07 – 2014-11-08 (×3): via INTRAVENOUS

## 2014-11-07 MED ORDER — POLYETHYLENE GLYCOL 3350 17 G PO PACK
17.0000 g | PACK | Freq: Every day | ORAL | Status: DC
  Administered 2014-11-07 – 2014-11-09 (×3): 17 g via ORAL
  Filled 2014-11-07 (×4): qty 1

## 2014-11-07 MED ORDER — BUTALBITAL-APAP-CAFFEINE 50-325-40 MG PO TABS
2.0000 | ORAL_TABLET | Freq: Four times a day (QID) | ORAL | Status: DC | PRN
  Administered 2014-11-07 – 2014-11-09 (×3): 2 via ORAL
  Administered 2014-11-10 (×2): 1 via ORAL
  Filled 2014-11-07 (×3): qty 2
  Filled 2014-11-07 (×2): qty 1

## 2014-11-07 MED ORDER — DOCUSATE SODIUM 100 MG PO CAPS: 100.0000 mg | ORAL_CAPSULE | Freq: Every day | ORAL | Status: DC | PRN

## 2014-11-07 MED ORDER — PIPERACILLIN-TAZOBACTAM 3.375 G IVPB
3.3750 g | Freq: Three times a day (TID) | INTRAVENOUS | Status: DC
  Administered 2014-11-07 – 2014-11-10 (×10): 3.375 g via INTRAVENOUS
  Filled 2014-11-07 (×12): qty 50

## 2014-11-07 NOTE — Progress Notes (Signed)
ANTIBIOTIC CONSULT NOTE - INITIAL  Pharmacy Consult for Zosyn Indication: Endometritis  Allergies  Allergen Reactions  . Cephalosporins Nausea And Vomiting  . Fentanyl Nausea And Vomiting  . Oxycodone Nausea And Vomiting and Other (See Comments)    Nausea vomiting and severe light headedness.  Marland Kitchen. Penicillins Rash    Able to take amox. / Pt has tolerated ampicillin IV in past     Vital Signs: Temp: 100.2 F (37.9 C) (01/06 0930) Temp Source: Oral (01/06 0930) BP: 122/72 mmHg (01/06 0930) Pulse Rate: 124 (01/06 1037)    Labs: Labs pending    Microbiology: Recent Results (from the past 720 hour(s))  OB RESULT CONSOLE Group B Strep     Status: None   Collection Time: 10/30/14  1:36 PM  Result Value Ref Range Status   GBS Positive  Final    Medical History: Past Medical History  Diagnosis Date  . PONV (postoperative nausea and vomiting)     Assessment: Postpartum day 7 with fevers to 103.5, generalized weakness, abdominal & back pain.  Pt has a reported PCN allergy that is rash & cephalosporin allergy that is nausea & vomiting.  Upon reviewing her history, pt tolerated IV ampicillin during her last admission.  Plan:  Zosyn 3.375 grams IV Q 8 hr  Natasha BenceCline, Jock Mahon 11/07/2014,11:02 AM

## 2014-11-07 NOTE — Progress Notes (Signed)
At approximately 4:30 this morning patient woke up with fever of 103 and significant body aches, pains and chills.  The most significant pain is in her vaginal area but she is also having lower abdominal pain low back pain and pain in her breast and left axilla.  She called the nurse line and they suggested that she come here to the office to be evaluated.

## 2014-11-07 NOTE — Consult Note (Signed)
Lactation consultation; initial consultation for a re-admission. Baby is 637 days old.  Baby has been exclusively breast fed since birth 10/31/14. Baby feeds on demand, 8 to 12 times per day, for 10 to 20 minutes. Output has been adequate per mom. Baby is slightly below birth weight. Mom's milk is in.   At this time, mom is uncomfortable and feels overall very unwell.   Mom c/o sore left breast, breasts are warm to the touch, no streaking. Nipples are very sore with cracks and scabs. Mom request to pump and feed baby with bottle until she is more comfortable. DEP initiated, mom and dad instructed in its use and cleaning. Discussed with dad how to use a bottle and paced feeding. Slow flow nipples provided.   Baby has not fed in about 3 hours, and mom's breasts full and firm, but not engorged. Breasts soften and become more comfortable with pumping.   Mom pumped about 3 ounces, and dad plans to feed it to baby.   Comfort gels provided. Encouraged mom to put only breast milk or comfort gels on nipples; no OTC nipple cream or lanolin at this time.   Mom states she will want to latch baby when she is feeling better. Inst mom to request [facility] assistance for next breastfeed. Enc mom to call for assistance if she has any concerns.

## 2014-11-07 NOTE — Progress Notes (Signed)
Patient ID: [name], female   DOB: 05-21-1981, 34 y.o.   MRN: [id number] 34 yo PPD#7 presenting to the office with complaints of generalized weakness, fevers, chills, abdominal and back pain which started yesterday. Patient reports fever as high as 103.5 at home. She took ibuprofen 20-30 minutes prior to coming to the office this morning. Without much improvement in her symptoms. She is breastfeeding and denies breast engorgement.   Past Medical History  Diagnosis Date  . PONV (postoperative nausea and vomiting)    Past Surgical History  Procedure Laterality Date  . Knee arthroscopy Right 12/2011    Microfracture  . Knee arthroscopy Left 08/2008  . Tonsillectomy and adenoidectomy     Family History  Problem Relation Age of Onset  . Cancer Mother     stomach  . Hypertension Father   . Diabetes Sister   . Cancer Maternal Grandfather     brain  . Cancer Paternal Grandmother     breast  . Heart disease Paternal Grandfather    GENERAL: Well-developed, well-nourished female in no acute distress.  HEENT: Normocephalic, atraumatic. Sclerae anicteric.  NECK: Supple. Normal thyroid.  LUNGS: Clear to auscultation bilaterally.  HEART: Regular rate and rhythm. BREASTS: Symmetric in size. No palpable masses or lymphadenopathy, skin changes, or nipple drainage. No engorgement or breast tenderness ABDOMEN: Soft, nondistended. Fundus firm and very tender to touch PELVIC: Normal external female genitalia with repaired vaginal laceration in healing stages. Still very raw. Internal exam not performed secondary to patient discomfort EXTREMITIES: No cyanosis, clubbing, or edema, 2+ distal pulses.  A/P 34 yo PPD#7 with pp endometritis likely secondary to prolonged second stage of labor - Recommended hospital admission for parenteral administration of antibiotics - Patient was informed that she will have to stay at least 24-48 hours afebrile before we can consider discharge home - Patient  agreed. Women's unit contacted and attending on call also contacted

## 2014-11-07 NOTE — H&P (Signed)
[name] is an 34 y.o. female. Who is being admitted for treatment of endometritis.  She has had fevers and abdominal pain since yesterday. Also c/o difficulty initiating urine stream and constipation. Using peppermint for former and Dulcolax for latter (no results). Breastfeeding but now has fullness, needing to pump. Seen by LC.   Note from Office (Dr Jolayne Pantheronstant)   Expand All Collapse All   Patient ID: Shannon HeadMegan Spraw Green, female DOB: 12-Feb-1981, 34 y.o. MRN: [id number] 34 yo PPD#7 presenting to the office with complaints of generalized weakness, fevers, chills, abdominal and back pain which started yesterday. Patient reports fever as high as 103.5 at home. She took ibuprofen 20-30 minutes prior to coming to the office this morning. Without much improvement in her symptoms. She is breastfeeding and denies breast engorgement.        OB History: G1, P1001   Past Medical History  Diagnosis Date  . PONV (postoperative nausea and vomiting)     Past Surgical History  Procedure Laterality Date  . Knee arthroscopy Right 12/2011    Microfracture  . Knee arthroscopy Left 08/2008  . Tonsillectomy and adenoidectomy      Family History  Problem Relation Age of Onset  . Cancer Mother     stomach  . Hypertension Father   . Diabetes Sister   . Cancer Maternal Grandfather     brain  . Cancer Paternal Grandmother     breast  . Heart disease Paternal Grandfather     Social History:  reports that she has never smoked. She has never used smokeless tobacco. She reports that she does not drink alcohol or use illicit drugs.  Allergies:  Allergies  Allergen Reactions  . Cephalosporins Nausea And Vomiting    Pt has tolerated IV ampicillin  . Fentanyl Nausea And Vomiting  . Oxycodone Nausea And Vomiting and Other (See Comments)    Nausea vomiting and severe light headedness.  Marland Kitchen. Penicillins Rash    Able to take amox. / Pt has tolerated ampicillin IV in past    Prescriptions prior  to admission  Medication Sig Dispense Refill Last Dose  . Butalbital-APAP-Caffeine 50-325-40 MG per capsule Take 1-2 capsules by mouth every 6 (six) hours as needed for headache. 30 capsule 3 Past Week at Unknown time  . cetirizine (ZYRTEC) 10 MG tablet Take 10 mg by mouth daily.   11/06/2014 at Unknown time  . ibuprofen (ADVIL,MOTRIN) 800 MG tablet Take 800 mg by mouth every 8 (eight) hours as needed for fever, headache or moderate pain.   11/07/2014 at Unknown time  . prenatal vitamin w/FE, FA (PRENATAL 1 + 1) 27-1 MG TABS tablet Take 1 tablet by mouth daily at 12 noon.   11/06/2014 at Unknown time  . HYDROcodone-acetaminophen (NORCO/VICODIN) 5-325 MG per tablet Take 1-2 tablets by mouth every 4 (four) hours as needed for moderate pain. (Patient not taking: Reported on 11/07/2014) 20 tablet 0 Not Taking    Review of Systems  Constitutional: Positive for fever, chills and malaise/fatigue.  Gastrointestinal: Positive for abdominal pain and constipation. Negative for nausea, vomiting and diarrhea.  Genitourinary: Negative for dysuria.  Neurological: Positive for headaches.  Psychiatric/Behavioral: Negative for depression.    Blood pressure 122/72, pulse 124, temperature 100.2 F (37.9 C), temperature source Oral, resp. rate 24, last menstrual period 01/14/2014, SpO2 100 %, unknown if currently breastfeeding. Physical Exam  Constitutional: She is oriented to person, place, and time. She appears well-developed and well-nourished. No distress.  HENT:  Green:  Normocephalic.  Cardiovascular: Normal rate, regular rhythm and normal heart sounds.  Exam reveals no gallop and no friction rub.   No murmur heard. Respiratory: Effort normal and breath sounds normal. No respiratory distress. She has no wheezes. She has no rales. She exhibits no tenderness.  GI: Soft. She exhibits no distension and no mass. There is tenderness (over lower abdomen). There is no rebound and no guarding.  Genitourinary:  Deferred.  See office exam   Musculoskeletal: Normal range of motion. She exhibits no edema.  Neurological: She is alert and oriented to person, place, and time. She has normal reflexes. She exhibits normal muscle tone.  Skin: Skin is warm and dry.  Psychiatric: She has a normal mood and affect.    Results for orders placed or performed during the hospital encounter of 11/07/14 (from the past 24 hour(s))  CBC WITH DIFFERENTIAL     Status: Abnormal   Collection Time: 11/07/14 11:25 AM  Result Value Ref Range   WBC 17.8 (H) 4.0 - 10.5 K/uL   RBC 3.00 (L) 3.87 - 5.11 MIL/uL   Hemoglobin 9.2 (L) 12.0 - 15.0 g/dL   HCT 40.9 (L) 81.1 - 91.4 %   MCV 90.7 78.0 - 100.0 fL   MCH 30.7 26.0 - 34.0 pg   MCHC 33.8 30.0 - 36.0 g/dL   RDW 78.2 95.6 - 21.3 %   Platelets 333 150 - 400 K/uL   Neutrophils Relative % 93 (H) 43 - 77 %   Neutro Abs 16.6 (H) 1.7 - 7.7 K/uL   Lymphocytes Relative 3 (L) 12 - 46 %   Lymphs Abs 0.5 (L) 0.7 - 4.0 K/uL   Monocytes Relative 4 3 - 12 %   Monocytes Absolute 0.7 0.1 - 1.0 K/uL   Eosinophils Relative 0 0 - 5 %   Eosinophils Absolute 0.0 0.0 - 0.7 K/uL   Basophils Relative 0 0 - 1 %   Basophils Absolute 0.0 0.0 - 0.1 K/uL    No results found.  Assessment/Plan: A:  Postpartum x 7 days, s/p vacuum assisted vaginal delivery      Postpartum endometritis  P;  Admit        Routine orders       Zosyn        Add Miralax for constipation              [facility] 11/07/2014, 11:52 AM

## 2014-11-08 LAB — CBC
HCT: 23.7 % — ABNORMAL LOW (ref 36.0–46.0)
HEMOGLOBIN: 8.2 g/dL — AB (ref 12.0–15.0)
MCH: 31.3 pg (ref 26.0–34.0)
MCHC: 34.6 g/dL (ref 30.0–36.0)
MCV: 90.5 fL (ref 78.0–100.0)
Platelets: 275 10*3/uL (ref 150–400)
RBC: 2.62 MIL/uL — ABNORMAL LOW (ref 3.87–5.11)
RDW: 13 % (ref 11.5–15.5)
WBC: 8.8 10*3/uL (ref 4.0–10.5)

## 2014-11-08 MED ORDER — DICLOXACILLIN SODIUM 500 MG PO CAPS
500.0000 mg | ORAL_CAPSULE | Freq: Four times a day (QID) | ORAL | Status: DC
  Administered 2014-11-08 – 2014-11-09 (×4): 500 mg via ORAL
  Filled 2014-11-08 (×6): qty 1

## 2014-11-08 MED ORDER — BENZOCAINE-MENTHOL 20-0.5 % EX AERO: 1.0000 "application " | INHALATION_SPRAY | Freq: Four times a day (QID) | CUTANEOUS | Status: DC | PRN

## 2014-11-08 NOTE — Progress Notes (Signed)
   11/08/14 1500  Clinical Encounter Type  Visited With Patient and family together (husband Harvie HeckRandy, Chief of Staffbaby Kaleb)  Visit Type Initial;Spiritual support;Social support  Referral From (Lactation--Chris Nedra HaiLee, RN/Jennifer, Charity fundraiserN)  Spiritual Encounters  Spiritual Needs Emotional  Stress Factors  Patient Stress Factors Loss of control;Major life changes;Health changes   Referred by Lactation for spiritual and emotional support through pt's stressful experience of readmission and pain after a birth plan that did not go as she'd hoped.  Shannon MilletMegan was exhausted and pumping at time of visit.  Per pt, she was a Orthoptistchaplain resident and on-call chaplain at [employer], so she is familiar with Spiritual Care services.  Family aware of ongoing availability for support, encouragement, and opportunity to process their story if desired, but please also page as needs arise:  [phone].  Thank you.  [address]Chaplain Seva Chancy QuinlanLundeen, [place] [phone]

## 2014-11-08 NOTE — Progress Notes (Signed)
Subjective: 1. Decreased abdominal pain 2. Significant worsening of pain and redness on outer left breast and very tender 3. Scabbed nipples and painful latch. 4. Tolerating PO, + flatus and Normal BM yesterday.    Objective: BP 112/70 mmHg  Pulse 116  Temp(Src) 99.4 F (37.4 C) (Oral)  Resp 18  Ht  (1.676 m)  Wt 76.476 kg (168 lb 9.6 oz)  BMI 27.23 kg/m2  SpO2 100%  LMP 01/14/2014 Results for orders placed or performed during the hospital encounter of 11/07/14 (from the past 24 hour(s))  CBC WITH DIFFERENTIAL     Status: Abnormal   Collection Time: 11/07/14 11:25 AM  Result Value Ref Range   WBC 17.8 (H) 4.0 - 10.5 K/uL   RBC 3.00 (L) 3.87 - 5.11 MIL/uL   Hemoglobin 9.2 (L) 12.0 - 15.0 g/dL   HCT 16.1 (L) 09.6 - 04.5 %   MCV 90.7 78.0 - 100.0 fL   MCH 30.7 26.0 - 34.0 pg   MCHC 33.8 30.0 - 36.0 g/dL   RDW 40.9 81.1 - 91.4 %   Platelets 333 150 - 400 K/uL   Neutrophils Relative % 93 (H) 43 - 77 %   Neutro Abs 16.6 (H) 1.7 - 7.7 K/uL   Lymphocytes Relative 3 (L) 12 - 46 %   Lymphs Abs 0.5 (L) 0.7 - 4.0 K/uL   Monocytes Relative 4 3 - 12 %   Monocytes Absolute 0.7 0.1 - 1.0 K/uL   Eosinophils Relative 0 0 - 5 %   Eosinophils Absolute 0.0 0.0 - 0.7 K/uL   Basophils Relative 0 0 - 1 %   Basophils Absolute 0.0 0.0 - 0.1 K/uL  Comprehensive metabolic panel     Status: Abnormal   Collection Time: 11/07/14 11:25 AM  Result Value Ref Range   Sodium 139 135 - 145 mmol/L   Potassium 3.9 3.5 - 5.1 mmol/L   Chloride 109 96 - 112 mEq/L   CO2 22 19 - 32 mmol/L   Glucose, Bld 100 (H) 70 - 99 mg/dL   BUN 16 6 - 23 mg/dL   Creatinine, Ser 7.82 0.50 - 1.10 mg/dL   Calcium 8.5 8.4 - 95.6 mg/dL   Total Protein 5.9 (L) 6.0 - 8.3 g/dL   Albumin 3.1 (L) 3.5 - 5.2 g/dL   AST 22 0 - 37 U/L   ALT 24 0 - 35 U/L   Alkaline Phosphatase 96 39 - 117 U/L   Total Bilirubin 0.3 0.3 - 1.2 mg/dL   GFR calc non Af Amer >90 >90 mL/min   GFR calc Af Amer >90 >90 mL/min   Anion gap 8 5 - 15   Culture, blood (routine x 2)     Status: None (Preliminary result)   Collection Time: 11/07/14 11:25 AM  Result Value Ref Range   Specimen Description BLOOD RIGHT ARM    Special Requests BOTTLES DRAWN AEROBIC AND ANAEROBIC 10CC BOTH    Culture             BLOOD CULTURE RECEIVED NO GROWTH TO DATE CULTURE WILL BE HELD FOR 5 DAYS BEFORE ISSUING A FINAL NEGATIVE REPORT Performed at [facility]    Report Status PENDING   Culture, blood (routine x 2)     Status: None (Preliminary result)   Collection Time: 11/07/14 11:29 AM  Result Value Ref Range   Specimen Description BLOOD LEFT ARM    Special Requests      BOTTLES DRAWN AEROBIC AND ANAEROBIC 10CC  BOTH BOTTLE   Culture             BLOOD CULTURE RECEIVED NO GROWTH TO DATE CULTURE WILL BE HELD FOR 5 DAYS BEFORE ISSUING A FINAL NEGATIVE REPORT Performed at [facility]    Report Status PENDING   CBC     Status: Abnormal   Collection Time: 11/08/14  5:16 AM  Result Value Ref Range   WBC 8.8 4.0 - 10.5 K/uL   RBC 2.62 (L) 3.87 - 5.11 MIL/uL   Hemoglobin 8.2 (L) 12.0 - 15.0 g/dL   HCT 16.123.7 (L) 09.636.0 - 04.546.0 %   MCV 90.5 78.0 - 100.0 fL   MCH 31.3 26.0 - 34.0 pg   MCHC 34.6 30.0 - 36.0 g/dL   RDW 40.913.0 81.111.5 - 91.415.5 %   Platelets 275 150 - 400 K/uL   I have reviewed patient's vital signs, medications and labs.  General: alert, cooperative, appears stated age, fatigued and mild distress. Pt smiling, normal affect.  GI: soft, non-tender; bowel sounds normal; no masses,  no organomegaly Breasts: Right breast firm, no redness. Pt pumping adequate milk. Left Breast w/ defined area of redness, warmth, swelling and tenderness from 2 o'clock to 7 o'clock. No evidence of abscess, but exam limited by pt pumping. Will reexamine later today when pt not pumping. Small amount of milk being pumped from left breast.   Assessment/Plan: 1. PP endometritis, improving on Zosyn Plan: Continue Zosyn  2. New Dx Left Mastitis, worsening while  pt on Zosyn.  Plan: Per consult w/ Dr. Jolayne Pantheronstant start PO Dycloxicillin. Lactation consult done. Will continue to see pt for mastitis, engorgement, nipple trauma. CBC w/ dif in am.   3. Nipple trauma likely due to tongue-tied baby per Lactation.  Plan: Lactation will contact Pt's Peds to discuss clipping of frenulum.   4. Constipation resolving, but pt feels as if she needs to continue Miralax. Plan: Daily Miralax. Push PO fluids. Reassured Pt that BMs will not interfere w/ stitches.    LOS: 1 day    Dorathy KinsmanSMITH, Dorsie Burich 11/08/2014, 9:52 AM

## 2014-11-08 NOTE — Consult Note (Addendum)
Lactation consult done, 8 days post-partum, admitted for endometrial infection. Asked to see mom by RN, Tania to rule out mastitis of left breast. Upon exam, mom's left breast edematous, red, hot and tender with obvious line of demarcation on 90% of breast. Both nipple excoriated with scabbing. Mom reports painful latch, pain with pumping, and lack of EBM with pumping. Mom with signs and symptoms of mastitis.   Baby present in room with mom, FOB consoling cueing baby by allowing baby to suckle. Baby very fussy and showing strong hunger cues. On exam of baby's oral anatomy, posterior frenulum approximately half-way back from tip of tongue. Tongue forms bowl-shape with elevation, and frenulum appears tight and blanched with elevation. Parents shown findings and enc to discuss with baby's pediatrician. Parents aware LC will inform pediatrician, Dr. Suzie PortelaMoffitt, of [facility], of findings.  Mom had not pumped since yesterday, was putting baby to breast. Assisted mom to use DEBP, and mom obtained 90 mls of EBM in 30 minutes (60mls from right breast and 30 mls from left). While pumping, mom given ice pacts for left breast and enc to pump both breast and ice left breast every 2-3 hours. Mom reports increased comfort of both breasts with pumping and left breast less swollen and red with application of ice.   Baby weighed prior to bottle-feeding, and baby weighed 7 lbs and 8.2 oz. Per parents, baby weighed 7 lbs 14 oz at birth and 7 lbs and 3 oz at pediatrician's office 11-05-14. FOB bottle-fed 65 mls to baby. Baby tolerated well, and appeared satiated afterwards. Enc parents to feed baby with cues and not to wait longer than 3 hours to feed.   Mom intends to pump and bottle-feed today. Mom given written plan for pumping and feeding. Nurse Midwife, [place] present at bedside and agrees that mom has mastitis and is re-evaluating antibiotics.   Mom enc to ask for assistance with pumping and feeding baby as  needed.   Dr. Gaynelle ArabianKristin Moffitt of [facility] was contacted and phone message left regarding assessment of mom's breast and baby's oral anatomy.

## 2014-11-09 ENCOUNTER — Inpatient Hospital Stay (HOSPITAL_COMMUNITY): Payer: BLUE CROSS/BLUE SHIELD

## 2014-11-09 DIAGNOSIS — N61 Mastitis without abscess: Secondary | ICD-10-CM

## 2014-11-09 LAB — CBC WITH DIFFERENTIAL/PLATELET
BASOS ABS: 0 10*3/uL (ref 0.0–0.1)
Basophils Relative: 0 % (ref 0–1)
EOS PCT: 2 % (ref 0–5)
Eosinophils Absolute: 0.1 10*3/uL (ref 0.0–0.7)
HCT: 23.8 % — ABNORMAL LOW (ref 36.0–46.0)
Hemoglobin: 8 g/dL — ABNORMAL LOW (ref 12.0–15.0)
LYMPHS ABS: 1 10*3/uL (ref 0.7–4.0)
Lymphocytes Relative: 13 % (ref 12–46)
MCH: 30.9 pg (ref 26.0–34.0)
MCHC: 33.6 g/dL (ref 30.0–36.0)
MCV: 91.9 fL (ref 78.0–100.0)
Monocytes Absolute: 0.2 10*3/uL (ref 0.1–1.0)
Monocytes Relative: 3 % (ref 3–12)
NEUTROS ABS: 6 10*3/uL (ref 1.7–7.7)
NEUTROS PCT: 82 % — AB (ref 43–77)
PLATELETS: 275 10*3/uL (ref 150–400)
RBC: 2.59 MIL/uL — ABNORMAL LOW (ref 3.87–5.11)
RDW: 13.2 % (ref 11.5–15.5)
WBC: 7.3 10*3/uL (ref 4.0–10.5)

## 2014-11-09 MED ORDER — VANCOMYCIN HCL IN DEXTROSE 1-5 GM/200ML-% IV SOLN
1000.0000 mg | Freq: Two times a day (BID) | INTRAVENOUS | Status: DC
  Administered 2014-11-09 – 2014-11-10 (×3): 1000 mg via INTRAVENOUS
  Filled 2014-11-09 (×4): qty 200

## 2014-11-09 MED ORDER — MUPIROCIN 2 % EX OINT
TOPICAL_OINTMENT | Freq: Three times a day (TID) | CUTANEOUS | Status: DC
  Administered 2014-11-09 – 2014-11-10 (×4): via TOPICAL
  Filled 2014-11-09: qty 22

## 2014-11-09 NOTE — Progress Notes (Signed)
Subjective: Patient reports fever early this morning, pain in left breast not responding to antibiotics, mild abdominal discomfort, pain on perineum. Minimal lochia Constipation  Objective: I have reviewed patient's vital signs, labs and microbiology.  General: alert, cooperative and no distress GI: mild abdominal tenderness.  Uterus less tender. Extremities: no edema, redness or tenderness in the calves or thighs Vaginal Bleeding: none Perineum--healing well; no evidence of infection or disruption.  Breasts:  Evolving impetigo on nipples, Left breast enlarged, red and painful; no fluctuant area  Assessment/Plan: 34 yo 13P1 female with improving endometritis and mastitis unresponsive to antibiotics  #ID--Blood cultures to date are negative; Stop Dicloxacillin and start Vancomycin for MRSA coverage  #Breast--Bactroban for nipples and breast US (pt continues to spike fever after >24 hours on antibiotics)  #Gyn--If abdomen is still tender of fevers continue (and breast US negative); consider US or CT to look at uterus; Continue nml pericare.    LOS: 2 days    LEGGETT,KELLY H. 11/09/2014, 9:35 AM

## 2014-11-09 NOTE — Consult Note (Signed)
Follow up Lactation consult with this mom. She is doing better today, as per dad. Mom was sleeping. Mom is pumping 45 mls from right breast, and a few mls from the infected left breast. Mom was pumping every 2 hours, is pumping more like every 3 now. I gave mom's nurse, Gelene Minkhris G. And  dad information on all purpose nipple cream, and  Asked that if mom gets discharged to home, that a prescription for this cream be written for her. The baby is being supplemenetd with alimentum.  Formula, and I brought some more to the room for them. Lactation will follow up with mom tomorrow.

## 2014-11-09 NOTE — Progress Notes (Signed)
ANTIBIOTIC CONSULT NOTE - INITIAL  Pharmacy Consult for Vancomycin Indication: endometritis  Allergies  Allergen Reactions  . Cephalosporins Nausea And Vomiting    Pt has tolerated IV ampicillin  . Fentanyl Nausea And Vomiting  . Oxycodone Nausea And Vomiting and Other (See Comments)    Nausea vomiting and severe light headedness.  Marland Kitchen. Penicillins Rash    Reaction as a child. Able to take amox PO/IV ampicillin    Patient Measurements: Pt wt 76.5kg  Vital Signs: Temp: 98.3 F (36.8 C) (01/08 0558) Temp Source: Oral (01/08 0558) BP: 116/79 mmHg (01/08 0558) Pulse Rate: 98 (01/08 0558) Intake/Output from previous day: 01/07 0701 - 01/08 0700 In: 840 [P.O.:840] Out: 1950 [Urine:1950] Intake/Output from this shift:    Labs:  Recent Labs  11/07/14 1125 11/08/14 0516 11/09/14 0527  WBC 17.8* 8.8 7.3  HGB 9.2* 8.2* 8.0*  PLT 333 275 275  CREATININE 0.83  --   --    Estimated Creatinine Clearance: 98.9 mL/min (by C-G formula based on Cr of 0.83). No results for input(s): VANCOTROUGH, VANCOPEAK, VANCORANDOM, GENTTROUGH, GENTPEAK, GENTRANDOM, TOBRATROUGH, TOBRAPEAK, TOBRARND, AMIKACINPEAK, AMIKACINTROU, AMIKACIN in the last 72 hours.   Microbiology: Recent Results (from the past 720 hour(s))  OB RESULT CONSOLE Group B Strep     Status: None   Collection Time: 10/30/14  1:36 PM  Result Value Ref Range Status   GBS Positive  Final  Culture, blood (routine x 2)     Status: None (Preliminary result)   Collection Time: 11/07/14 11:25 AM  Result Value Ref Range Status   Specimen Description BLOOD RIGHT ARM  Final   Special Requests BOTTLES DRAWN AEROBIC AND ANAEROBIC 10CC BOTH  Final   Culture   Final           BLOOD CULTURE RECEIVED NO GROWTH TO DATE CULTURE WILL BE HELD FOR 5 DAYS BEFORE ISSUING A FINAL NEGATIVE REPORT Performed at [facility]    Report Status PENDING  Incomplete  Culture, blood (routine x 2)     Status: None (Preliminary result)   Collection  Time: 11/07/14 11:29 AM  Result Value Ref Range Status   Specimen Description BLOOD LEFT ARM  Final   Special Requests   Final    BOTTLES DRAWN AEROBIC AND ANAEROBIC 10CC BOTH BOTTLE   Culture   Final           BLOOD CULTURE RECEIVED NO GROWTH TO DATE CULTURE WILL BE HELD FOR 5 DAYS BEFORE ISSUING A FINAL NEGATIVE REPORT Performed at [facility]    Report Status PENDING  Incomplete    Medical History: Past Medical History  Diagnosis Date  . PONV (postoperative nausea and vomiting)    Medications:  Zosyn IV 3.375g q8h  01/06-->  Dicloxacillin 500 mg QID   01/07-->01/08  Assessment: 34 y/o F G1P1 PPD #9.  Treated for endometritis with Zosyn.  Pt also has mastitis and was started on dicloxacillin and not responding, so change to Vancomycin and d/c dicloxacillin.   Fever not improving with Tmax of 101.5 overnight. WBC WNL.  Goal of Therapy:  Vancomycin trough 10-15 mcg/mL  Plan:  Vancomycin IV 1000 mg q12h F/u renal function, vancomycin trough   Hurley CiscoMendenhall, Michaelia Beilfuss D 11/09/2014,9:05 AM

## 2014-11-09 NOTE — Progress Notes (Signed)
Post Partum Day 9 Subjective: pt stressed with demands of pumping, slow response to treatment, and frustrated with long dosings of Zosyn., which interferes with breast care and breast pumping.  Objective: Blood pressure 130/80, pulse 104, temperature 99.7 F (37.6 C), temperature source Axillary, resp. rate 18, height 5\' 6"  (1.676 m), weight 168 lb 9.6 oz (76.476 kg), last menstrual period 01/14/2014, SpO2 100 %, unknown if currently breastfeeding.  Physical Exam:  Temp:  [98.1 F (36.7 C)-101.3 F (38.5 C)] 99.7 F (37.6 C) (01/08 1800) Pulse Rate:  [93-110] 104 (01/08 1800) Resp:  [12-18] 18 (01/08 1800) BP: (106-130)/(74-80) 130/80 mmHg (01/08 1800) SpO2:  [99 %-100 %] 100 % (01/08 1800)  General: alert and fatigued Lochia: appropriate Uterine Fundus:  Incision:  DVT Evaluation:  Breast u/s today: No breast abscess see results in Epic.  Recent Labs  11/08/14 0516 11/09/14 0527  HGB 8.2* 8.0*  HCT 23.7* 23.8*    Assessment/Plan: Mastitis, possible endometritis. Plan : will speed up Zosyn to 2.5 hours /dose to facilitate pt breast care            Vancomycin and Zosyn are compatible for concurrent dosing              LOS: 2 days   [name] V 11/09/2014, 9:21 PM

## 2014-11-09 NOTE — Lactation Note (Signed)
Lactation Consultation Note Mom re-admitted for Endometritis and Mastitis developed right after admitted mom stated. The WHole Lt,. Breast is red, hot to touch, knots, tender hard, pitting edema to under side of breast. Nipple w/white slimy skin, and scabs. Rt. Breast nipple is white slimy stuff. Not red. ICE applied to Lt. Breast to relieve some edema to be able to pump. Mom is pumping and bottle feeding. To painful to latch to breast. RN had noted baby had tight frenulum. Instructed mom to massage breast during pumping and BFwhen she started back. Produced 1 oz. To Rt. Breast and 10ml from Lt. Breast. Explained to ICE will help swelling and that will allow breast to drain like they want. Massaged breast to assist in softening, Lt. Breast tight, softens after icing. Rt. Breast pumping fine draining milk. Nipples have white thick mucous tissue film w/scabs. Mom has comfort gels. Baby fussy hungry from mom not pumping enough. Supplementing w/Alement 19 cal. And slow flow nipple. Slept well after that feeding.  Saw mom a second time d/t unable to get hardly anything our of Lt. Breast during pumping. Lt,breast very hard, hot, and red. Ice applied and massage gently, w/6210ml out of Lt. Breast 30ml from Rt. Mom exhausted. Mom started having chills and shaking. Notified RN of pt. Shivering. Had temp of 101. Pitting edema worse to Lt. Breast to bottom side around to inner mid breast. ICE helpful to it soften enough to massage. Edema cont. Encouraged mom to ICE prior to pumping to help soften breast to allow release of milk during pumping and gently massage breast.  Patient Name: [name] Today's Date: 11/09/2014     Maternal Data    Feeding    LATCH Score/Interventions                      Lactation Tools Discussed/Used     Consult Status      [name], [name] 11/09/2014, 3:20 PM

## 2014-11-10 DIAGNOSIS — O9122 Nonpurulent mastitis associated with the puerperium: Secondary | ICD-10-CM

## 2014-11-10 DIAGNOSIS — O9089 Other complications of the puerperium, not elsewhere classified: Secondary | ICD-10-CM

## 2014-11-10 DIAGNOSIS — O8612 Endometritis following delivery: Secondary | ICD-10-CM

## 2014-11-10 DIAGNOSIS — K5900 Constipation, unspecified: Secondary | ICD-10-CM

## 2014-11-10 MED ORDER — CLINDAMYCIN HCL 300 MG PO CAPS: 300.0000 mg | ORAL_CAPSULE | Freq: Three times a day (TID) | ORAL | Status: DC

## 2014-11-10 MED ORDER — HYDROCODONE-ACETAMINOPHEN 5-325 MG PO TABS: 1.0000 | ORAL_TABLET | ORAL | Status: DC | PRN

## 2014-11-10 NOTE — Lactation Note (Signed)
Lactation Consultation Note  Patient Name: [name] OZHYQ'MToday's Date: 11/10/2014   Visited with Mom and FOB.  Mom being discharged this afternoon on oral antibiotics (temp WNL).  Mom continues to pump both breasts every 2 hrs, up to 30 mins.  Both nipples scabbing over. Encouraged her to pump for 20 minutes max, and every 2-3 hrs.  Concerned about Mom being stressed about her milk supply not meeting her baby's need.  Mom typically obtaining 1 1/2-2 oz per pumping.  Baby takes 2-3 oz per feedings.  Offered pump rental, but Mom declined.  She has a borrowed PhotographerMedela PIS at home.  Explained how the [employer] rental would be the best choice to support her milk supply.  Mom and FOB had many questions about feeding Alimentum formula, APNO Rx, using a nipple shield as they have one at home.  Referred formula questions to baby's pediatrician.  Recommended an OP lactation appointment, but Mom declined as her insurance doesn't cover cost.  She stated she was going for a Lactation appointment (at Pediatrician office), on the day she was admitted to hospital for mastitis.  Encouraged her to reschedule that appointment.  Offered phone consults, and free Breast Feeding Support Groups available.  To call prn.   Judee ClaraSmith, [place] Monds E 11/10/2014, 4:46 PM

## 2014-11-10 NOTE — Progress Notes (Signed)
Patient is upset/frustrated d/t knowledge that she is to receive IV abx zosyn and vancomycin tonight, was under the impression that zosyn was discontinued and vancomycin was only once every 24 hours. Called on call attending, Dr Emelda FearFerguson, discussed case, he will come to floor to speak with patient.

## 2014-11-10 NOTE — Progress Notes (Signed)
Discharge instructions reviewed with patient.  Patient states understanding of home care, medications, activity, signs/symptoms to report to MD and return MD office visit.  Patients significant other and family will assist with her care @ home.  No home equipment needed.  Prescriptions given to patient and all personal belongings.  Patient ambulated for discharge in stable condition with staff without incident.

## 2014-11-10 NOTE — Discharge Summary (Signed)
Physician Discharge Summary  Patient ID: [name] MRN: [id number] DOB/AGE: [birth year] 34 y.o.  Admit date: 11/07/2014 Discharge date: 11/10/2014  Admission Diagnoses:  Discharge Diagnoses:  Active Problems:   Postpartum endometritis   Constipation   Acute mastitis of left breast   Discharged Condition: good  Hospital Course: Admitted PPD#7 with endometritis, started on zosyn.  Subsequently developed mastitis on day 2 of zosyn, started on dicloxacillin but continued to develop fevers so on d2 dicloxacillin changed to vancomycin which she received for 2 days.  Last fever 0200 11/09/2014, more than 24h afebrile.  F/u clinic in 1 week Discharged with clindamycin 300mg  TID x 10days  Consults: None  Significant Diagnostic Studies: sono breast to r/o abscess negative  Treatments: IV antibiotics as above  Discharge Exam: Blood pressure 106/72, pulse 93, temperature 98.3 F (36.8 C), temperature source Oral, resp. rate 18, height 5\' 6"  (1.676 m), weight 168 lb 9.6 oz (76.476 kg), last menstrual period 01/14/2014, SpO2 98 %, unknown if currently breastfeeding. See progress note for today  Disposition: 01-Home or Self Care     Medication List    TAKE these medications        Butalbital-APAP-Caffeine 50-325-40 MG per capsule  Take 1-2 capsules by mouth every 6 (six) hours as needed for headache.     cetirizine 10 MG tablet  Commonly known as:  ZYRTEC  Take 10 mg by mouth daily.     clindamycin 300 MG capsule  Commonly known as:  CLEOCIN  Take 1 capsule (300 mg total) by mouth 3 (three) times daily.     HYDROcodone-acetaminophen 5-325 MG per tablet  Commonly known as:  NORCO/VICODIN  Take 1-2 tablets by mouth every 4 (four) hours as needed for moderate pain.     ibuprofen 800 MG tablet  Commonly known as:  ADVIL,MOTRIN  Take 800 mg by mouth every 8 (eight) hours as needed for fever, headache or moderate pain.     prenatal vitamin w/FE, FA 27-1 MG Tabs tablet  Take  1 tablet by mouth daily at 12 noon.           Follow-up Information    [facility] [place] In 1 week.   Specialty:  Obstetrics and Gynecology   Contact information:   [address] [place] [phone]      Signed: Perry MountCOSTA,Sevan Mcbroom ROCIO 11/10/2014, 4:21 PM

## 2014-11-10 NOTE — Discharge Instructions (Signed)
Breastfeeding and Mastitis °Mastitis is inflammation of the breast tissue. It can occur in women who are breastfeeding. This can make breastfeeding painful. Mastitis will sometimes go away on its own. Your health care provider will help determine if treatment is needed. °CAUSES °Mastitis is often associated with a blocked milk (lactiferous) duct. This can happen when too much milk builds up in the breast. Causes of excess milk in the breast can include: °· Poor latch-on. If your baby is not latched onto the breast properly, she or he may not empty your breast completely while breastfeeding. °· Allowing too much time to pass between feedings. °· Wearing a bra or other clothing that is too tight. This puts extra pressure on the lactiferous ducts so milk does not flow through them as it should. °Mastitis can also be caused by a bacterial infection. Bacteria may enter the breast tissue through cuts or openings in the skin. In women who are breastfeeding, this may occur because of cracked or irritated skin. Cracks in the skin are often caused when your baby does not latch on properly to the breast. °SIGNS AND SYMPTOMS °· Swelling, redness, tenderness, and pain in an area of the breast. °· Swelling of the glands under the arm on the same side. °· Fever may or may not accompany mastitis. °If an infection is allowed to progress, a collection of pus (abscess) may develop. °DIAGNOSIS  °Your health care provider can usually diagnose mastitis based on your symptoms and a physical exam. Tests may be done to help confirm the diagnosis. These may include: °· Removal of pus from the breast by applying pressure to the area. This pus can be examined in the lab to determine which bacteria are present. If an abscess has developed, the fluid in the abscess can be removed with a needle. This can also be used to confirm the diagnosis and determine the bacteria present. In most cases, pus will not be present. °· Blood tests to determine if  your body is fighting a bacterial infection. °· Mammogram or ultrasound tests to rule out other problems or diseases. °TREATMENT  °Mastitis that occurs with breastfeeding will sometimes go away on its own. Your health care provider may choose to wait 24 hours after first seeing you to decide whether a prescription medicine is needed. If your symptoms are worse after 24 hours, your health care provider will likely prescribe an antibiotic medicine to treat the mastitis. He or she will determine which bacteria are most likely causing the infection and will then select an appropriate antibiotic medicine. This is sometimes changed based on the results of tests performed to identify the bacteria, or if there is no response to the antibiotic medicine selected. Antibiotic medicines are usually given by mouth. You may also be given medicine for pain. °HOME CARE INSTRUCTIONS °· Only take over-the-counter or prescription medicines for pain, fever, or discomfort as directed by your health care provider. °· If your health care provider prescribed an antibiotic medicine, take the medicine as directed. Make sure you finish it even if you start to feel better. °· Do not wear a tight or underwire bra. Wear a soft, supportive bra. °· Increase your fluid intake, especially if you have a fever. °· Continue to empty the breast. Your health care provider can tell you whether this milk is safe for your infant or needs to be thrown out. You may be told to stop nursing until your health care provider thinks it is safe for your baby.   Use a breast pump if you are advised to stop nursing. °· Keep your nipples clean and dry. °· Empty the first breast completely before going to the other breast. If your baby is not emptying your breasts completely for some reason, use a breast pump to empty your breasts. °· If you go back to work, pump your breasts while at work to stay in time with your nursing schedule. °· Avoid allowing your breasts to become  overly filled with milk (engorged). °SEEK MEDICAL CARE IF: °· You have pus-like discharge from the breast. °· Your symptoms do not improve with the treatment prescribed by your health care provider within 2 days. °SEEK IMMEDIATE MEDICAL CARE IF: °· Your pain and swelling are getting worse. °· You have pain that is not controlled with medicine. °· You have a red line extending from the breast toward your armpit. °· You have a fever or persistent symptoms for more than 2-3 days. °· You have a fever and your symptoms suddenly get worse. °MAKE SURE YOU:  °· Understand these instructions. °· Will watch your condition. °· Will get help right away if you are not doing well or get worse. °Document Released: 02/13/2005 Document Revised: 10/24/2013 Document Reviewed: 05/25/2013 °ExitCare® Patient Information ©2015 [employer]. This information is not intended to replace advice given to you by your health care provider. Make sure you discuss any questions you have with your health care provider. ° °

## 2014-11-10 NOTE — Progress Notes (Signed)
Post Partum Day 10 ,readmitted for mastitis, and ? Of endometritis Subjective: Pt appreciative of the shorter IV regimens, able to do breast care easier. She is hoping to be reevaluated this pm after 4 for possible d/c today, as the breast is softer and less red. Lower abdomen is normal postpartum status  Objective: Blood pressure 112/71, pulse 89, temperature 98.2 F (36.8 C), temperature source Oral, resp. rate 18, height 5\' 6"  (1.676 m), weight 76.476 kg (168 lb 9.6 oz), last menstrual period 01/14/2014, SpO2 100 %, unknown if currently breastfeeding. Sjhe is pumping and br feeding  Temp:  [98.1 F (36.7 C)-99.7 F (37.6 C)] 98.2 F (36.8 C) (01/09 0623) Pulse Rate:  [78-104] 89 (01/09 0623) Resp:  [12-18] 18 (01/09 0623) BP: (106-130)/(71-80) 112/71 mmHg (01/09 0623) SpO2:  [99 %-100 %] 100 % (01/09 16100623)  Physical Exam:  General: alert, appears stated age, distracted, fatigued and anxious , but purposefully caring for breast, pumping Lochia: appropriate Uterine Fundus: nontender Incision:  DVT Evaluation: neg Breast: soft , with residual redness on inferior outer quadrant of left breast, but able to tolerate palpation with less discomfort by pt description  Recent Labs  11/08/14 0516 11/09/14 0527  HGB 8.2* 8.0*  HCT 23.7* 23.8*    Assessment/Plan: resolving mastitis left breast Breastfeeding  Will check temps thru day report to Dr Loreta Aveacosta at 4 pm   LOS: 3 days   Nayanna Seaborn V 11/10/2014, 9:33 AM

## 2014-11-12 ENCOUNTER — Telehealth: Payer: Self-pay | Admitting: Obstetrics and Gynecology

## 2014-11-12 DIAGNOSIS — O9279 Other disorders of lactation: Secondary | ICD-10-CM

## 2014-11-12 MED ORDER — BREAST PUMP MISC: 1.0000 | Freq: Every day | Status: DC

## 2014-11-12 NOTE — Addendum Note (Signed)
Addended by: Pennie BanterSMITH, Edana Aguado W on: 11/12/2014 03:19 PM   Modules accepted: Orders

## 2014-11-12 NOTE — Telephone Encounter (Signed)
Patient called requesting need for Rx to be faxed over for her breast pump.  Order sent to [facility].  Fax [phone].

## 2014-11-13 LAB — CULTURE, BLOOD (ROUTINE X 2)
CULTURE: NO GROWTH
CULTURE: NO GROWTH

## 2014-11-16 ENCOUNTER — Ambulatory Visit (INDEPENDENT_AMBULATORY_CARE_PROVIDER_SITE_OTHER): Payer: BLUE CROSS/BLUE SHIELD | Admitting: Obstetrics & Gynecology

## 2014-11-16 VITALS — BP 136/81 | HR 109 | Temp 98.1°F | Ht 66.0 in | Wt 162.0 lb

## 2014-11-16 DIAGNOSIS — O8612 Endometritis following delivery: Secondary | ICD-10-CM

## 2014-11-16 DIAGNOSIS — N61 Inflammatory disorders of breast: Secondary | ICD-10-CM

## 2014-11-16 NOTE — Progress Notes (Signed)
Here for hospital follow-up. Some mild pain, pain in nipples.

## 2014-11-16 NOTE — Progress Notes (Signed)
   Subjective:    Patient ID: [name], female    DOB: May 08, 1981, 34 y.o.   MRN: [id number]  HPI  34 yo MW P1 now 16 days pp s/p IOL/VAVD and pp endometritis and mastitis. She is still on po clinda. She reports clinical improvement. Denies fevers. She is pumping due to extreme nipple pain. She recently saw a Advertising copywriterlactation consultant [place] and has a follow up appt scheduled. She was treated with antifungals.   Review of Systems     Objective:   Physical Exam No evidence of mastitis Nipples look very painful and crusty Bimanual exam is benign, NT  General- [id number]     Assessment & Plan:  Endometritis- continue clinda

## 2014-11-28 ENCOUNTER — Encounter: Payer: Self-pay | Admitting: Obstetrics and Gynecology

## 2014-11-28 ENCOUNTER — Ambulatory Visit (INDEPENDENT_AMBULATORY_CARE_PROVIDER_SITE_OTHER): Payer: BLUE CROSS/BLUE SHIELD | Admitting: Obstetrics and Gynecology

## 2014-11-28 VITALS — BP 120/70 | HR 93 | Ht 66.0 in | Wt 159.4 lb

## 2014-11-28 DIAGNOSIS — N644 Mastodynia: Secondary | ICD-10-CM

## 2014-11-28 DIAGNOSIS — B3789 Other sites of candidiasis: Secondary | ICD-10-CM | POA: Diagnosis not present

## 2014-11-28 MED ORDER — CLOTRIMAZOLE-BETAMETHASONE 1-0.05 % EX CREA: 1.0000 "application " | TOPICAL_CREAM | Freq: Two times a day (BID) | CUTANEOUS | Status: DC

## 2014-11-28 MED ORDER — FLUCONAZOLE 200 MG PO TABS: ORAL_TABLET | ORAL | Status: DC

## 2014-11-28 NOTE — Progress Notes (Signed)
Patient ID: [name], female   DOB: 07/08/1981, 34 y.o.   MRN: [id number] 34 yo G1P1 s/p SVD on 10/31/2014 with postpartum course complicated by hospital admission for endometritis and left breast mastitis presenting today with bilateral breast pain. Patient reports being seen by a lactation consultant with concerns for yeast infection of the breast. The pediatrician evaluated the infant and he did not have oral thrush but was treated for it anyway. Patient reports bilateral nipple pain. She is unable to breastfeed. She is currently expressing milk  GENERAL: Well-developed, well-nourished female in no acute distress.  BREASTS: Symmetric in size. No palpable masses or lymphadenopathy, skin changes, or nipple drainage. 1 cm of erythema encircling the nipple bilaterally EXTREMITIES: No cyanosis, clubbing, or edema, 2+ distal pulses.  A/P 34 yo with bilateral nipple pain - Low suspicion for candidiasis of the breast but will treat with a 14-day course of diflucan with topical antifungal - pain may be related to feeding as the nipples are adapting  - Reassurance provided - Follow up for routine postpartum care

## 2014-12-13 ENCOUNTER — Encounter: Payer: Self-pay | Admitting: Obstetrics & Gynecology

## 2014-12-13 ENCOUNTER — Ambulatory Visit (INDEPENDENT_AMBULATORY_CARE_PROVIDER_SITE_OTHER): Payer: BLUE CROSS/BLUE SHIELD | Admitting: Obstetrics & Gynecology

## 2014-12-13 VITALS — BP 112/73 | HR 111 | Wt 159.0 lb

## 2014-12-13 DIAGNOSIS — O9122 Nonpurulent mastitis associated with the puerperium: Secondary | ICD-10-CM

## 2014-12-13 NOTE — Patient Instructions (Signed)
Mastitis Mastitis is inflammation of the breast tissue. It occurs most often in women who are breastfeeding, but it can also affect other women, and even sometimes men. CAUSES  Mastitis is usually caused by a bacterial infection. Bacteria enter the breast tissue through cuts or openings in the skin. Typically, this occurs with breastfeeding because of cracked or irritated skin. Sometimes, it can occur even when there is no opening in the skin. It can be associated with plugged milk (lactiferous) ducts. Nipple piercing can also lead to mastitis. Also, some forms of breast cancer can cause mastitis. SIGNS AND SYMPTOMS   Swelling, redness, tenderness, and pain in an area of the breast.  Swelling of the glands under the arm on the same side.  Fever. If an infection is allowed to progress, a collection of pus (abscess) may develop. DIAGNOSIS  Your health care provider can usually diagnose mastitis based on your symptoms and a physical exam. Tests may be done to help confirm the diagnosis. These may include:   Removal of pus from the breast by applying pressure to the area. This pus can be examined in the lab to determine which bacteria are present. If an abscess has developed, the fluid in the abscess can be removed with a needle. This can also be used to confirm the diagnosis and determine the bacteria present. In most cases, pus will not be present.  Blood tests to determine if your body is fighting a bacterial infection.  Mammogram or ultrasound tests to rule out other problems or diseases. TREATMENT  Antibiotic medicine is used to treat a bacterial infection. Your health care provider will determine which bacteria are most likely causing the infection and will select an appropriate antibiotic. This is sometimes changed based on the results of tests performed to identify the bacteria, or if there is no response to the antibiotic selected. Antibiotics are usually given by mouth. You may also be  given medicine for pain. Mastitis that occurs with breastfeeding will sometimes go away on its own, so your health care provider may choose to wait 24 hours after first seeing you to decide whether a prescription medicine is needed. HOME CARE INSTRUCTIONS   Only take over-the-counter or prescription medicines for pain, fever, or discomfort as directed by your health care provider.  If your health care provider prescribed an antibiotic, take the medicine as directed. Make sure you finish it even if you start to feel better.  Do not wear a tight or underwire bra. Wear a soft, supportive bra.  Increase your fluid intake, especially if you have a fever.  Women who are breastfeeding should follow these instructions:  Continue to empty the breast. Your health care provider can tell you whether this milk is safe for your infant or needs to be thrown out. You may be told to stop nursing until your health care provider thinks it is safe for your baby. Use a breast pump if you are advised to stop nursing.  Keep your nipples clean and dry.  Empty the first breast completely before going to the other breast. If your baby is not emptying your breasts completely for some reason, use a breast pump to empty your breasts.  If you go back to work, pump your breasts while at work to stay in time with your nursing schedule.  Avoid allowing your breasts to become overly filled with milk (engorged). SEEK MEDICAL CARE IF:   You have pus-like discharge from the breast.  Your symptoms do not   improve with the treatment prescribed by your health care provider within 2 days. SEEK IMMEDIATE MEDICAL CARE IF:   Your pain and swelling are getting worse.  You have pain that is not controlled with medicine.  You have a red line extending from the breast toward your armpit.  You have a fever or persistent symptoms for more than 2-3 days.  You have a fever and your symptoms suddenly get worse. Document Released:  10/19/2005 Document Revised: 10/24/2013 Document Reviewed: 05/19/2013 ExitCare Patient Information 2015 [employer]. This information is not intended to replace advice given to you by your health care provider. Make sure you discuss any questions you have with your health care provider.  

## 2014-12-13 NOTE — Progress Notes (Signed)
Patient ID: [name], female   DOB: 12/31/1980, 34 y.o.   MRN: [id number] Subjective:     [name] is a 34 y.o. female who presents for a postpartum visit. She is 6 weeks postpartum following a vacuum assisted vag delivery. I have fully reviewed the prenatal and intrapartum course. The delivery was at term. Outcome: vacuum, outlet. Anesthesia: epidural. Postpartum course has been complicated with mastitis on the left and now the right side and endometritis. Baby's course has been uncomplicated. Baby is feeding by breast milk- pt pumping due to the mastitis.. Bleeding thin lochia. Bowel function is normal. Bladder function is normal. Patient is not sexually active. Contraception method is abstinence. Postpartum depression screening: negative.  Pt reports that the new sx began 4 days prev.   She reports that she had fever and chills which has improved. She was able to express milk on the left until yesterday.  She reports that her pain is worse on the right side.  The following portions of the patient's history were reviewed and updated as appropriate: allergies, current medications, past family history, past medical history, past social history, past surgical history and problem list.  Review of Systems Pertinent items are noted in HPI.   Objective:    BP 112/73 mmHg  Pulse 111  Wt 159 lb (72.122 kg)  Breastfeeding? Yes  General:  alert and mild distress   Breasts:  positive findings: tendernes and induration on the left breast.  NOT acutely tender.  Right side tender and indurated.  No fluctuant.  Warm.  and .        Abdomen: soft, non-tender; bowel sounds normal; no masses,  no organomegaly   Vulva:  normal- small area that is still healing at introitus   Vagina: normal vagina, no discharge, exudate, lesion, or erythema  Cervix:  no cervical motion tenderness  Corpus: normal size, contour, position, consistency, mobility, non-tender  Adnexa:  no mass, fullness, tenderness   Rectal Exam: Not performed.        Assessment:     6 week postpartum exam. Pap smear not done at today's visit.   Mastitis- keep Clindamycin,  Warm compresses. Express milk.   Plan:    1. Contraception: condoms 2. Keep Clindamycin 300mg  qid 3. Follow up in: 1 week or as needed.

## 2014-12-20 ENCOUNTER — Ambulatory Visit
Admission: RE | Admit: 2014-12-20 | Discharge: 2014-12-20 | Disposition: A | Discharge status: Home / Self Care | Payer: BLUE CROSS/BLUE SHIELD | Source: Ambulatory Visit | Attending: Obstetrics & Gynecology | Admitting: Obstetrics & Gynecology

## 2014-12-20 ENCOUNTER — Ambulatory Visit (INDEPENDENT_AMBULATORY_CARE_PROVIDER_SITE_OTHER): Payer: BLUE CROSS/BLUE SHIELD | Admitting: Obstetrics & Gynecology

## 2014-12-20 ENCOUNTER — Other Ambulatory Visit: Payer: BLUE CROSS/BLUE SHIELD

## 2014-12-20 ENCOUNTER — Encounter: Payer: Self-pay | Admitting: Obstetrics & Gynecology

## 2014-12-20 ENCOUNTER — Other Ambulatory Visit: Payer: Self-pay | Admitting: Obstetrics & Gynecology

## 2014-12-20 VITALS — BP 89/60 | HR 128 | Temp 98.0°F | Wt 160.0 lb

## 2014-12-20 DIAGNOSIS — N644 Mastodynia: Secondary | ICD-10-CM

## 2014-12-20 DIAGNOSIS — O9123 Nonpurulent mastitis associated with lactation: Secondary | ICD-10-CM

## 2014-12-20 DIAGNOSIS — N6001 Solitary cyst of right breast: Secondary | ICD-10-CM

## 2014-12-20 DIAGNOSIS — N61 Mastitis without abscess: Secondary | ICD-10-CM

## 2014-12-20 MED ORDER — FLUCONAZOLE 200 MG PO TABS: 200.0000 mg | ORAL_TABLET | Freq: Every day | ORAL | Status: DC

## 2014-12-20 NOTE — Patient Instructions (Signed)
Return to clinic for any obstetric concerns or go to [facility] for evaluation  

## 2014-12-20 NOTE — Progress Notes (Signed)
   CLINIC ENCOUNTER NOTE  History:  34 y.o. G1P1001 here today for follow up for postpartum right mastitis.  Patient has had a long protracted course of bilateral mastitis and was hospitalized initially for vancomycin therapy and drainage of left breast abscess. Has been on multiple antibiotic therapies (also for postpartum endometritis) and fluconazole therapy.  She was prescribed Clindamycin again one week ago for right mastitis and told to follow up today. She reports that the mastitis was getting better but she woke up this morning with a fever and tender right breast which she feels is a recurrence of her symptoms.  Left breast is okay. On further interview, she reports son has thrush.  Patient is very frustrated as she is unable to breastfeed properly and has to rely on donor milk to feed her infant.  She is still on Clindamycin therapy.  She is accompanied by her husband who is also very concerned.   The following portions of the patient's history were reviewed and updated as appropriate: allergies, current medications, past family history, past medical history, past social history, past surgical history and problem list. Normal pap and negative HRHPV on 02/19/14.  Review of Systems:  Pertinent items are noted in HPI.  Objective:  Physical Exam BP 89/60 mmHg  Pulse 128  Temp(Src) 98 F (36.7 C)  Wt 160 lb (72.576 kg)  Breastfeeding? Yes Gen: NAD Breast:  Large area of right breast involving distal half of right breast with diffuse blanching erythema, warmth and induration. Very tender to touch.  Normal left breast.   Assessment & Plan:  Recurrent/persistent right mastitis, already on Clindamycin therapy.  Concerned about fungal mastitis or breast abscess.  [facility]; fluconazole also presumptively prescribed.  Patient is frustrated of the alternating cycles of antibiotics and antifungals, hope the ultrasound will show something  more definitive. If fever recurs (98 F today) or symptoms worsen, she may need hospitalization for stronger course of antibiotics or other therapy/breast specialist consultation. Routine preventative health maintenance measures emphasized.   Shannon CollinsUGONNA  Paiten Boies, MD, FACOG Attending Obstetrician & Gynecologist [facility]

## 2014-12-27 ENCOUNTER — Ambulatory Visit (HOSPITAL_COMMUNITY)
Admission: RE | Admit: 2014-12-27 | Discharge: 2014-12-27 | Disposition: A | Discharge status: Home / Self Care | Payer: BLUE CROSS/BLUE SHIELD | Source: Ambulatory Visit | Attending: Obstetrics & Gynecology | Admitting: Obstetrics & Gynecology

## 2014-12-27 ENCOUNTER — Encounter: Payer: Self-pay | Admitting: Obstetrics & Gynecology

## 2014-12-27 ENCOUNTER — Ambulatory Visit (INDEPENDENT_AMBULATORY_CARE_PROVIDER_SITE_OTHER): Payer: BLUE CROSS/BLUE SHIELD | Admitting: Obstetrics & Gynecology

## 2014-12-27 VITALS — BP 117/81 | HR 92 | Wt 156.0 lb

## 2014-12-27 DIAGNOSIS — O924 Hypogalactia: Secondary | ICD-10-CM | POA: Diagnosis not present

## 2014-12-27 DIAGNOSIS — O9122 Nonpurulent mastitis associated with the puerperium: Secondary | ICD-10-CM

## 2014-12-27 MED ORDER — METOCLOPRAMIDE HCL 5 MG PO TABS: 5.0000 mg | ORAL_TABLET | Freq: Four times a day (QID) | ORAL | Status: DC

## 2014-12-27 NOTE — Progress Notes (Signed)
Subjective:     Patient ID: [name], female   DOB: November 18, 1980, 34 y.o.   MRN: [id number]  HPI pt reports that her sx have improved.  She denies current fever or chills.  She reports that the pain has improved. She is trying to get infant to latch on. She has restarted fenugreek to increase milk production. She just started it yesterday. Feel that the infant has a poor/shallow latch.   Review of Systems     Objective:   Physical Exam BP 117/81 mmHg  Pulse 92  Wt 156 lb (70.761 kg)  Breastfeeding? Yes Pt in NAD Breast- markedly improved from last visit.  Still with some nipple erythema bilaterally.  No masses or induration noted   12/20/2014 CLINICAL DATA: Patient is currently breast-feeding with redness and pain of the medial right breast; patient is been on antibiotics.  EXAM: ULTRASOUND OF THE RIGHT BREAST  COMPARISON: Previous exam(s).  FINDINGS: Targeted ultrasound is performed, showing no focal discrete abscess at the right breast 3 o'clock position area of skin redness and pain.  IMPRESSION: Findings consistent with mastitis. No focal discrete abscess.  RECOMMENDATION: Management on clinical basis.  I have discussed the findings and recommendations with the patient. Results were also provided in writing at the conclusion of the visit. If applicable, a reminder letter will be sent to the patient regarding the next appointment.  BI-RADS CATEGORY 2: Benign Finding(s)     Assessment:     Mastitis- resolved Decreased milk production     Plan:     Continue fenugreek If that does not work Reglan 5mg  q 6hour until increased milk production F/u prn Pt to contact hospital lactation consultants for referral for outpt assistance- does have appt in 1 week with lactation consultant at [facility]

## 2014-12-27 NOTE — Patient Instructions (Signed)
Metoclopramide injection What is this medicine? METOCLOPRAMIDE (met oh kloe PRA mide) is used to treat people with slow emptying of the stomach and intestinal tract. It may be used to prevent nausea and vomiting caused by cancer treatment or surgery. This medicine may also be used before certain stomach exams or procedures and for decreased milk production in breastfeeding. This medicine may be used for other purposes; ask your health care provider or pharmacist if you have questions. COMMON BRAND NAME(S): Reglan What should I tell my health care provider before I take this medicine? They need to know if you have any of these conditions: -breast cancer -depression -diabetes -heart failure -high blood pressure -kidney disease -liver disease -Parkinson's disease or a movement disorder -pheochromocytoma -seizures -stomach obstruction, bleeding, or perforation -an unusual or allergic reaction to metoclopramide, procainamide, sulfites, other medicines, foods, dyes, or preservatives -pregnant or trying to get pregnant  How should I use this medicine? This medicine is for injection into a muscle or for infusion into a vein. It is given by a health care professional in a hospital or clinic setting. A special MedGuide will be given to you by the pharmacist with each prescription and refill. Be sure to read this information carefully each time. Talk to your pediatrician regarding the use of this medicine in children. Special care may be needed. Overdosage: If you think you have taken too much of this medicine contact a poison control center or emergency room at once. NOTE: This medicine is only for you. Do not share this medicine with others. What if I miss a dose? This does not apply. What may interact with this medicine? -acetaminophen -cyclosporine -digoxin -medicines for blood pressure -medicines for depression, especially an Monoamine Oxidase Inhibitor (MAOI) -medicines for diabetes,  including insulin -medicines for hay fever and other allergies -medicines for Parkinson's disease, like levodopa -medicines for sleep or for pain -tetracycline This list may not describe all possible interactions. Give your health care provider a list of all the medicines, herbs, non-prescription drugs, or dietary supplements you use. Also tell them if you smoke, drink alcohol, or use illegal drugs. Some items may interact with your medicine. What should I watch for while using this medicine? It may take a few weeks for your stomach condition to get better. However, do not take this medicine for longer than 12 weeks. The longer you take this medicine, and the more you take it, the greater your chances are of developing serious side effects. If you are an elderly patient, a female patient, or you have diabetes, you may be at an increased risk for side effects from this medicine. Contact your doctor immediately if you start having movements you cannot control such as lip smacking, rapid movements of the tongue, involuntary or uncontrollable movements of the eyes, head, arms and legs, or muscle twitches and spasms. Patients and their families should watch out for worsening depression or thoughts of suicide. Also watch out for any sudden or severe changes in feelings such as feeling anxious, agitated, panicky, irritable, hostile, aggressive, impulsive, severely restless, overly excited and hyperactive, or not being able to sleep. If this happens, especially at the beginning of treatment or after a change in dose, call your doctor. Do not treat yourself for high fever. Ask your doctor or health care professional for advice. You may get drowsy or dizzy. Do not drive, use machinery, or do anything that needs mental alertness until you know how this drug affects you. Do not stand or  sit up quickly, especially if you are an older patient. This reduces the risk of dizzy or fainting spells. Alcohol can make you more  drowsy and dizzy. Avoid alcoholic drinks. What side effects may I notice from receiving this medicine? Side effects that you should report to your doctor or health care professional as soon as possible: -allergic reactions like skin rash, itching or hives, swelling of the face, lips, or tongue -abnormal production of milk in females -breast enlargement in both males and females -change in the way you walk -difficulty moving, speaking or swallowing -drooling, lip smacking, or rapid movements of the tongue -excessive sweating -fever -involuntary or uncontrollable movements of the eyes, head, arms and legs -irregular heartbeat or palpitations -muscle twitches and spasms -unusually weak or tired Side effects that usually do not require medical attention (report to your doctor or health care professional if they continue or are bothersome): -change in sex drive or performance -depressed mood -diarrhea -difficulty sleeping -headache -menstrual changes -restless or nervous This list may not describe all possible side effects. Call your doctor for medical advice about side effects. You may report side effects to FDA at 1-800-FDA-1088. Where should I keep my medicine? This drug is given in a hospital or clinic and will not be stored at home. NOTE: This sheet is a summary. It may not cover all possible information. If you have questions about this medicine, talk to your doctor, pharmacist, or health care provider.  2015, Elsevier/Gold Standard. (2008-06-14 13:59:13)

## 2014-12-27 NOTE — Lactation Note (Signed)
Lactation Consult  Mother's reason for visit:  Feeding assistance Visit Type: Initial  Appointment Notes:   Consult:  Initial Lactation Consultant:  Michel Bickers  ________________________________________________________________________    ________________________________________________________________________  Mother's Name: [name] Type of delivery:  vaginal del Breastfeeding Experience: none  Maternal Medical Conditions:  history of Hospital readmit one week post partum with Mastitis. reacurrent Mastiits and treatment for Yeast and is currently being treated for yeast.   Mother was treated with Clindamycin recently and just finished a 14 day course . Maternal Medications: Prenatal vits, Diflucan 200-300 mg daily  ________________________________________________________________________  Frequency of breastfeeding: 1-2 times daily since Sunday Duration of feeding:  30-60 mins  Supplementation  Breastmilk , moms own and Donor milk   Volume 60-90 ml Frequency:  Every 2-3 hours        Method:  Bottle,   Pumping  Type of pump:  Medela pump in style Frequency:  Every 2-3 hours Volume:  45 ml  Infant Intake and Output Assessment  Voids: 8-10  in 24 hrs.  Color:  Clear yellow Stools: 0-  3 in 24 hrs.  Color:  Yellow  ________________________________________________________________________  Maternal Breast Assessment: mother nipples are very pink bilaterally. She has a pink halo around areolas bilaterally. Mother states nipples are so much better but she still has some pain. The Left nipple has a small crack at the back of the nipple shaft and a small crack in the center of the nipple.   Breast:  Filling Nipple:  Erect Pain level:  2 Pain interventions:  Binder    Initial feeding assessment:  Infant's oral assessment:  Variance, observed infant chewing and chomping at breast with lots of non-nurtritive suckling.infant pulling on and off the  breast  Positioning:  Cross cradle Right breast  LATCH documentation:  Latch:  2 = Grasps breast easily, tongue down, lips flanged, rhythmical sucking.  Audible swallowing:  1 = A few with stimulation  Type of nipple:  2 = Everted at rest and after stimulation  Comfort (Breast/Nipple):  1 = Filling, red/small blisters or bruises, mild/mod discomfort  Hold (Positioning):  1 = Assistance needed to correctly position infant at breast and maintain latch  LATCH score:  7  Attached assessment:  Shallow  Lips flanged:  No.  Lips untucked:  Yes.    Suck assessment:  Nutritive and Nonnutritive  Pre-feed weight:4656   g  10 lb.4.3 oz.) Post-feed weight:  4670 g (10 lb.5.7  oz.) Amount transferred: 14  ml   Additional Feeding Assessment -   Infant's oral assessment:  Variance, observed that infant has a high palate with a prominent upper gum ridge. 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 He does cup his tongue on both sides and rolls the tip of the tongue. He does have limited  lateral movement and doesn't elevate tongue well.   Positioning:  football Left breast  LATCH documentation:  Latch:  2 = Grasps breast easily, tongue down, lips flanged, rhythmical sucking.  Audible swallowing:  2 = Spontaneous and intermittent  Type of nipple:  2 = Everted at rest and after stimulation  Comfort (Breast/Nipple):  1 = Filling, red/small blisters or bruises, mild/mod discomfort  Hold (Positioning):  1 = Assistance needed to correctly position infant at breast and maintain latch  LATCH score:  8  Attached assessment:  Shallow  Lips flanged:  Yes.    Lips untucked:  Yes.    Suck assessment:  Nutritive and Nonnutritive, observed infant  chewing    Pre-feed weight:  4670 g  (10 lb. 5.7oz.) Post-feed weight:  4698 g (10 lb. 6.9oz.) Amount transferred: 28 ml Amount supplemented:  45 ml, expressed breast milk    Total amount transferred:  45 ml Total supplement given:  45 ml EBM with a  bottle  Advised mother to continue to offer breast as tolerated on cue Mother to supplement Kaleb with at least 60-90 ml of EBM after each breastfeeding  Mother to use good breast support and rotate positions to prevent more nipple trauma Advised mother to adjust infants lower and upper lip for wider gape Continue to post pump for 15-20 mins  Suggested to take supplement of choice to increase milk volume ( mothers milk plus) Advised to use APNO for yeast( Mother has Rx) Take a Probiotic of choice Mother was given a fact sheet on tips to prevent and treat yeast Suggest to follow up with Dr Naida Sleightavid [facility] Dermatologist if yeast persist for another week Advised to discuss treatment for Kaleb due to pulling on and off at the breast Mother has Rx for Reglan that she has not started yet. Advised to read Dr Butch PennyNewmans info on Reglan [facility] Advised mother to follow up with a pre and post weight next week [facility]

## 2014-12-30 ENCOUNTER — Other Ambulatory Visit: Payer: Self-pay | Admitting: Obstetrics & Gynecology

## 2014-12-30 MED ORDER — DICLOXACILLIN SODIUM 500 MG PO CAPS: 500.0000 mg | ORAL_CAPSULE | Freq: Four times a day (QID) | ORAL | Status: DC

## 2014-12-30 NOTE — Progress Notes (Signed)
Pt has mastitis with temp 103.  Just finished clindamycin.  Will start dicloxacillin 500 qid.  If not better by 11 a.m. On 12/31/14 [facility] and arrange home health for IV ancef qid.

## 2014-12-31 ENCOUNTER — Telehealth: Payer: Self-pay | Admitting: *Deleted

## 2014-12-31 NOTE — Telephone Encounter (Signed)
Patient called to let us know that she had to call over the weekend again due to another bout of mastitis.  Dr. Penne LashLeggett called in an antibiotic for her and she has had four doses and does not currently have a fever, the breast tissue is red but feels soft and not firm.  She just wanted to be sure she did not need to be seen.  I have advised her it sounds like she is responding to the antibiotic and to certainly call us back if her symptoms change at all to come into the office to be seen.  I went over the physician schedule for the week so she is aware when there will be doctor in the office.  She will call us back tomorrow with an update on how she is doing.  She will continue antibiotics and warm compresses and hydration.

## 2015-01-21 ENCOUNTER — Telehealth: Payer: Self-pay

## 2015-01-21 NOTE — Telephone Encounter (Signed)
Gus with [facility] request status of Reglan rx. Gus will contact Diane Day RN.

## 2015-01-21 NOTE — Telephone Encounter (Signed)
PLEASE NOTE: All timestamps contained within this report are represented as [place] Standard Time. CONFIDENTIALTY NOTICE: This fax transmission is intended only for the addressee. It contains information that is legally privileged, confidential or otherwise protected from use or disclosure. If you are not the intended recipient, you are strictly prohibited from reviewing, disclosing, copying using or disseminating any of this information or taking any action in reliance on or regarding this information. If you have received this fax in error, please notify us immediately by telephone so that we can arrange for its return to us. Phone: [phone], Toll-Free: [phone], Fax: [phone] Page: 1 of 2 Call Id: [id number] [facility] Client TELEPHONE ADVICE RECORD [facility] Patient Name: Shannon MartenMEGAN Green Gender: Female DOB: [birth year] Age: 34 Y 4 M 10 D Return Phone Number: Address: City/State/Zip: [place] Client [facility] Contact Type Call Call Type Triage / Clinical Caller Name Tobi Bastosnna [facility] Relationship To Patient Other Return Phone Number Unavailable Chief Complaint Paging or Request for Consult Initial Comment Caller states she is Tobi Bastosnna from [facility] and has pt that says the Rx was sent over electronically but they don't have the Rx. Return # [phone] Nurse Assessment Nurse: Su Hiltoberts, RN, Werner LeanShamia Date/Time (Eastern Time): 01/20/2015 3:11:28 PM Please select the assessment type ---RX called in but not at pharm Additional Documentation ---Caller was Tobi [facility]osnna from [facility] who stated pt called saying the [facility] told her that her prescription was ready, but pharmacy states they never received any order for it and it has never been ordered in past so it was not actually filled. Document the name of the medication. ---metoclopramide Pharmacy name and phone  number. [phone] Has the office closed within the last 30 minutes? ---No Does the client directives allow for assistance with medications after hours? ---Yes Is there an on-call physician for the client? ---Yes Additional Documentation ---RN explained to caller that since it is sounds like a non-urgent need, it is preferred that pt or pharmacist please call office in morning during office hours. RN did tell pharmacist that if pt wishes they are able to call and speak with us as well. Guidelines Guideline Title Affirmed Question Affirmed Notes Nurse Date/Time (Eastern Time) Disp. Time Lamount Cohen(Eastern Time) Disposition Final User 01/20/2015 3:18:19 PM Clinical Call Yes Su Hiltoberts, RN, Werner LeanShamia After Care Instructions Given Call Event Type User Date / Time Description PLEASE NOTE: All timestamps contained within this report are represented as [place] Standard Time. CONFIDENTIALTY NOTICE: This fax transmission is intended only for the addressee. It contains information that is legally privileged, confidential or otherwise protected from use or disclosure. If you are not the intended recipient, you are strictly prohibited from reviewing, disclosing, copying using or disseminating any of this information or taking any action in reliance on or regarding this information. If you have received this fax in error, please notify us immediately by telephone so that we can arrange for its return to us. Phone: [phone], Toll-Free: [phone], Fax: [phone] Page: 2 of 2 Call Id: [id number]

## 2015-01-21 NOTE — Telephone Encounter (Signed)
Spoke with Antoinette at [facility] office and advised to send to Diane Day RN.

## 2015-01-22 NOTE — Telephone Encounter (Signed)
[facility] and talked with Gus (pharmacist). The Rx did not e-prescribe because they have the pt listed in their file with last name Ralph LeydenDoyle. I provided the Rx information verbally. I then called the pt and left a message on her personal voice mail. I explained the reason that she was not able to pick up her medication and stated that the Rx has been given verbally by phone. She can get her medication later today. If she has questions, she can call back. .Marland Kitchen

## 2015-03-28 ENCOUNTER — Telehealth: Payer: Self-pay | Admitting: *Deleted

## 2015-03-28 DIAGNOSIS — O9279 Other disorders of lactation: Secondary | ICD-10-CM

## 2015-03-28 MED ORDER — METOCLOPRAMIDE HCL 10 MG PO TABS: 10.0000 mg | ORAL_TABLET | Freq: Three times a day (TID) | ORAL | Status: DC

## 2015-03-28 NOTE — Telephone Encounter (Signed)
Patient called and needed a prescription sent in for Reglan to take 30 mg.  I have sent in the prescription.

## 2015-05-01 ENCOUNTER — Encounter (INDEPENDENT_AMBULATORY_CARE_PROVIDER_SITE_OTHER): Payer: Self-pay

## 2015-05-01 ENCOUNTER — Ambulatory Visit (INDEPENDENT_AMBULATORY_CARE_PROVIDER_SITE_OTHER): Payer: BLUE CROSS/BLUE SHIELD | Admitting: Primary Care

## 2015-05-01 ENCOUNTER — Encounter: Payer: Self-pay | Admitting: Primary Care

## 2015-05-01 VITALS — BP 112/78 | HR 72 | Temp 98.0°F | Ht 66.0 in | Wt 149.8 lb

## 2015-05-01 DIAGNOSIS — G43109 Migraine with aura, not intractable, without status migrainosus: Secondary | ICD-10-CM

## 2015-05-01 DIAGNOSIS — R946 Abnormal results of thyroid function studies: Secondary | ICD-10-CM

## 2015-05-01 NOTE — Patient Instructions (Signed)
Follow up in 2 months for repeat labs and evaluation of thyroid.  It was a pleasure to meet you today! Please don't hesitate to call me with any questions. Welcome to [facility]!

## 2015-05-01 NOTE — Assessment & Plan Note (Signed)
[facility] TSH normal, abnormal Reverse T3 and Thyroid peroxidase Ab levels. She was encouraged to decrease gluten which she is currently doing. Reports fatigue, but has newborn. Some hair loss, newborn pulls it. Will follow up with labs in 2 months.

## 2015-05-01 NOTE — Progress Notes (Signed)
Pre visit review using our clinic review tool, if applicable. No additional management support is needed unless otherwise documented below in the visit note. 

## 2015-05-01 NOTE — Assessment & Plan Note (Signed)
Stable. Occur once every 2-3 months. No complaints of headaches.

## 2015-05-01 NOTE — Progress Notes (Signed)
Subjective:    Patient ID: [name], female    DOB: 07-Oct-1981, 34 y.o.   MRN: [id number]  HPI  [name] is a 34 year old female who presents today to establish care and discuss the problems mentioned below. Will obtain old records.  1) Migraines: Diagnosed 20 years ago. She will typically get them once every 2-3 months. She is aware of her triggers which include dairy, sugar, alcohol, strong scents, lack of sleep.  2) Thyroid abnormality: She completed routine labs at [facility] on 04/05/15 and is concerned about elevated thyroid function. Her TSH is normal but her Reverse T3 (25.5) and Thyroid Peroxidase Ab (184) are elevated. It was just suggested that she cut down on gluten and has been doing so for 2-3 weeks. Her vitamin D levels were low and is now taking an OTC supplement.  Review of Systems  Constitutional: Negative for unexpected weight change.  HENT: Negative for rhinorrhea.   Respiratory: Negative for cough and shortness of breath.   Cardiovascular: Negative for chest pain.  Gastrointestinal: Negative for diarrhea and constipation.  Genitourinary: Negative for difficulty urinating.  Musculoskeletal: Negative for myalgias and arthralgias.  Skin: Negative for rash.  Neurological: Negative for dizziness and headaches.  Psychiatric/Behavioral:       Denies concerns for anxiety or depression       Past Medical History  Diagnosis Date  . PONV (postoperative nausea and vomiting)   . Migraines   . Seasonal allergies     History   Social History  . Marital Status: Married    Spouse Name: N/A  . Number of Children: N/A  . Years of Education: N/A   Occupational History  . Not on file.   Social History Main Topics  . Smoking status: Never Smoker   . Smokeless tobacco: Never Used  . Alcohol Use: No  . Drug Use: No  . Sexual Activity: Yes    Birth Control/ Protection: None   Other Topics Concern  . Not on file   Social History Narrative   Married.   1  child, son.   Highest level of education is Masters.   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   Enjoy being outdoors, hiking.    Past Surgical History  Procedure Laterality Date  . Knee arthroscopy Right 12/2011    Microfracture  . Knee arthroscopy Left 08/2008  . Tonsillectomy and adenoidectomy      Family History  Problem Relation Age of Onset  . Cancer Mother     stomach  . Hypertension Father   . Diabetes Sister   . Cancer Maternal Grandfather     brain  . Cancer Paternal Grandmother     breast  . Heart disease Paternal Grandfather     Allergies  Allergen Reactions  . Cephalosporins Nausea And Vomiting    Pt has tolerated IV ampicillin  . Fentanyl Nausea And Vomiting  . Oxycodone Nausea And Vomiting and Other (See Comments)    Nausea vomiting and severe light headedness.  Marland Kitchen Penicillins Rash    Reaction as a child. Able to take amox PO/IV ampicillin    Current Outpatient Prescriptions on File Prior to Visit  Medication Sig Dispense Refill  . Butalbital-APAP-Caffeine 50-325-40 MG per capsule Take 1-2 capsules by mouth every 6 (six) hours as needed for headache. 30 capsule 3  . Fenugreek 610 MG CAPS Take 3 tablets by mouth 3 (three) times daily.    . Misc. Devices (BREAST PUMP) MISC 1  Device by Does not apply route daily. 1 each 0  . prenatal vitamin w/FE, FA (PRENATAL 1 + 1) 27-1 MG TABS tablet Take 1 tablet by mouth daily at 12 noon.     No current facility-administered medications on file prior to visit.    BP 112/78 mmHg  Pulse 72  Temp(Src) 98 F (36.7 C) (Oral)  Ht  (1.676 m)  Wt 149 lb 12.8 oz (67.949 kg)  BMI 24.19 kg/m2  SpO2 98%    Objective:   Physical Exam  Constitutional: She appears well-nourished.  Neck: Neck supple. No thyromegaly present.  Cardiovascular: Normal rate and regular rhythm.   Pulmonary/Chest: Effort normal and breath sounds normal.  Skin: Skin is warm and dry.  Psychiatric: She has a normal mood and affect.           Assessment & Plan:

## 2015-10-18 ENCOUNTER — Other Ambulatory Visit (INDEPENDENT_AMBULATORY_CARE_PROVIDER_SITE_OTHER): Payer: BLUE CROSS/BLUE SHIELD

## 2015-10-18 DIAGNOSIS — R946 Abnormal results of thyroid function studies: Secondary | ICD-10-CM | POA: Diagnosis not present

## 2015-10-21 ENCOUNTER — Encounter: Payer: Self-pay | Admitting: Primary Care

## 2015-10-21 ENCOUNTER — Ambulatory Visit (INDEPENDENT_AMBULATORY_CARE_PROVIDER_SITE_OTHER): Payer: BLUE CROSS/BLUE SHIELD | Admitting: Primary Care

## 2015-10-21 VITALS — BP 112/76 | HR 88 | Temp 97.9°F | Ht 66.0 in | Wt 137.9 lb

## 2015-10-21 DIAGNOSIS — R946 Abnormal results of thyroid function studies: Secondary | ICD-10-CM | POA: Diagnosis not present

## 2015-10-21 NOTE — Progress Notes (Signed)
Subjective:    Patient ID: [name], female    DOB: [birth year], 34 y.o.   MRN: [id number]  HPI  [name] is a 34 year old female who presents today for follow up for abnormal thyroid function test.  She was evaluated as a new patient in June 2016 and noted to have elevated thyroid levels from labs that were collected at her occupation. She presented initially as she noticed a reduction to her breast milk when attempting to feed her newborn. Her TSH was normal, but her reverse T3 and Thyroid Peroxidase Ab were elevated. She was told to decrease her consumption of gluten.  Since her last visit she's not eaten gluten. She continues to feel the the same as before, but not bad. Denies palpitations, hair loss, chest pain. She's had some fatigue, but is also caring for a new born.   Labs drawn on 10/18/15: TSH- normal, Thyroid peroxidase 195 (above goal, and higher than prior value of 182 in June 2016). Reverse T3 pending.   Review of Systems  Constitutional: Positive for fatigue.  Respiratory: Negative for shortness of breath.   Cardiovascular: Negative for chest pain and palpitations.  Endocrine: Negative for cold intolerance and heat intolerance.  Neurological: Negative for headaches.       Past Medical History  Diagnosis Date  . PONV (postoperative nausea and vomiting)   . Migraines   . Seasonal allergies     Social History   Social History  . Marital Status: Married    Spouse Name: N/A  . Number of Children: N/A  . Years of Education: N/A   Occupational History  . Not on file.   Social History Main Topics  . Smoking status: Never Smoker   . Smokeless tobacco: Never Used  . Alcohol Use: No  . Drug Use: No  . Sexual Activity: Yes    Birth Control/ Protection: None   Other Topics Concern  . Not on file   Social History Narrative   Married.   1 child, son.   Highest level of education is Masters.   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   Enjoy  being outdoors, hiking.    Past Surgical History  Procedure Laterality Date  . Knee arthroscopy Right 12/2011    Microfracture  . Knee arthroscopy Left 08/2008  . Tonsillectomy and adenoidectomy      Family History  Problem Relation Age of Onset  . Cancer Mother     stomach  . Hypertension Father   . Diabetes Sister   . Cancer Maternal Grandfather     brain  . Cancer Paternal Grandmother     breast  . Heart disease Paternal Grandfather     Allergies  Allergen Reactions  . Cephalosporins Nausea And Vomiting    Pt has tolerated IV ampicillin  . Fentanyl Nausea And Vomiting  . Oxycodone Nausea And Vomiting and Other (See Comments)    Nausea vomiting and severe light headedness.  Marland Kitchen. Penicillins Rash    Reaction as a child. Able to take amox PO/IV ampicillin    Current Outpatient Prescriptions on File Prior to Visit  Medication Sig Dispense Refill  . Butalbital-APAP-Caffeine 50-325-40 MG per capsule Take 1-2 capsules by mouth every 6 (six) hours as needed for headache. 30 capsule 3  . Fenugreek 610 MG CAPS Take 3 tablets by mouth 3 (three) times daily.    . Misc. Devices (BREAST PUMP) MISC 1 Device by Does not apply route daily. 1  each 0  . prenatal vitamin w/FE, FA (PRENATAL 1 + 1) 27-1 MG TABS tablet Take 1 tablet by mouth daily at 12 noon.     No current facility-administered medications on file prior to visit.    BP 112/76 mmHg  Pulse 88  Temp(Src) 97.9 F (36.6 C) (Oral)  Ht  (1.676 m)  Wt 137 lb 14.4 oz (62.551 kg)  BMI 22.27 kg/m2  SpO2 98%  LMP 09/03/2015    Objective:   Physical Exam  Constitutional: She appears well-nourished.  Neck: Neck supple. No thyromegaly present.  Cardiovascular: Normal rate and regular rhythm.   Pulmonary/Chest: Effort normal and breath sounds normal.  Skin: Skin is warm and dry.          Assessment & Plan:

## 2015-10-21 NOTE — Patient Instructions (Signed)
Stop by the front and speak with Shirlee LimerickMarion regarding your referral to Endocrinology.   Please schedule a physical with me in the summer of 2017. You may also schedule a lab only appointment 3-4 days prior. We will discuss your lab results in detail during your physical.  It was a pleasure to see you today!

## 2015-10-21 NOTE — Assessment & Plan Note (Signed)
Prior labs with elevated Thyroid Peroxidase and Reverse T3, normal TSH. Labs from 10/18/15 with even elevated thyroid peroxidase, normal TSH. Reverse t3 pending. Continues to feel fatigued, no palpitations or hair loss. Due to elevation in labs, will send to endocrinology for further evaluation.

## 2015-10-21 NOTE — Progress Notes (Signed)
Pre visit review using our clinic review tool, if applicable. No additional management support is needed unless otherwise documented below in the visit note. 

## 2015-10-22 LAB — T3, REVERSE: Reverse T3, Serum: 18.4 ng/dL (ref 9.2–24.1)

## 2015-10-22 LAB — THYROID PEROXIDASE ANTIBODY: THYROID PEROXIDASE ANTIBODY: 195 [IU]/mL — AB (ref 0–34)

## 2015-10-22 LAB — TSH: TSH: 1.3 u[IU]/mL (ref 0.450–4.500)

## 2015-11-06 ENCOUNTER — Ambulatory Visit (INDEPENDENT_AMBULATORY_CARE_PROVIDER_SITE_OTHER): Payer: BLUE CROSS/BLUE SHIELD | Admitting: Endocrinology

## 2015-11-06 ENCOUNTER — Encounter: Payer: Self-pay | Admitting: Endocrinology

## 2015-11-06 VITALS — BP 118/70 | HR 97 | Temp 98.0°F | Ht 66.0 in | Wt 133.0 lb

## 2015-11-06 DIAGNOSIS — R946 Abnormal results of thyroid function studies: Secondary | ICD-10-CM

## 2015-11-06 DIAGNOSIS — R739 Hyperglycemia, unspecified: Secondary | ICD-10-CM | POA: Diagnosis not present

## 2015-11-06 DIAGNOSIS — Z0189 Encounter for other specified special examinations: Secondary | ICD-10-CM

## 2015-11-06 DIAGNOSIS — Z0000 Encounter for general adult medical examination without abnormal findings: Secondary | ICD-10-CM

## 2015-11-06 LAB — POCT GLYCOSYLATED HEMOGLOBIN (HGB A1C): Hemoglobin A1C: 5.2

## 2015-11-06 LAB — GLUCOSE, POCT (MANUAL RESULT ENTRY): POC Glucose: 112 mg/dl — AB (ref 70–99)

## 2015-11-06 NOTE — Patient Instructions (Signed)
You should have your thyroid and blood sugar checked at least once per year. I would be happy to see you back here as necessary.

## 2015-11-06 NOTE — Progress Notes (Signed)
Subjective:    Patient ID: [name], female    DOB: [birth year], 35 y.o.   MRN: [id number]  HPI Pt is now 1 year postpartum.  The pregnancy was uncomplicated except for prolonged (>20 hrs) labor.  She was readmitted with 2 infections for a few days.  Baby weighed 7/14.  She reports 7 mos ago, she was found to have moderately low milk secretion from both breasts, and assoc fatigue.  She continued to breast feed, but is now weaning.  she has never been on prescribed thyroid hormone therapy.  she has never taken kelp or any other type of non-prescribed thyroid product.  she has never had thyroid imaging.  She is considering another pregnancy.  she has never had thyroid surgery, or XRT to the neck.  she has never been on amiodarone or lithium.   Past Medical History  Diagnosis Date  . PONV (postoperative nausea and vomiting)   . Migraines   . Seasonal allergies     Past Surgical History  Procedure Laterality Date  . Knee arthroscopy Right 12/2011    Microfracture  . Knee arthroscopy Left 08/2008  . Tonsillectomy and adenoidectomy      Social History   Social History  . Marital Status: Married    Spouse Name: N/A  . Number of Children: N/A  . Years of Education: N/A   Occupational History  . Not on file.   Social History Main Topics  . Smoking status: Never Smoker   . Smokeless tobacco: Never Used  . Alcohol Use: No  . Drug Use: No  . Sexual Activity: Yes    Birth Control/ Protection: None   Other Topics Concern  . Not on file   Social History Narrative   Married.   1 child, son.   Highest level of education is Masters.   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   Enjoy being outdoors, hiking.    Current Outpatient Prescriptions on File Prior to Visit  Medication Sig Dispense Refill  . Butalbital-APAP-Caffeine 50-325-40 MG per capsule Take 1-2 capsules by mouth every 6 (six) hours as needed for headache. 30 capsule 3  . prenatal vitamin w/FE, FA (PRENATAL 1 +  1) 27-1 MG TABS tablet Take 1 tablet by mouth daily at 12 noon.    . Fenugreek 610 MG CAPS Take 3 tablets by mouth 3 (three) times daily. Reported on 11/06/2015    . Misc. Devices (BREAST PUMP) MISC 1 Device by Does not apply route daily. (Patient not taking: Reported on 11/06/2015) 1 each 0   No current facility-administered medications on file prior to visit.    Allergies  Allergen Reactions  . Cephalosporins Nausea And Vomiting    Pt has tolerated IV ampicillin  . Fentanyl Nausea And Vomiting  . Oxycodone Nausea And Vomiting and Other (See Comments)    Nausea vomiting and severe light headedness.  Marland Kitchen Penicillins Rash    Reaction as a child. Able to take amox PO/IV ampicillin    Family History  Problem Relation Age of Onset  . Cancer Mother     stomach  . Hypertension Father   . Diabetes Mellitus I Sister   . Cancer Maternal Grandfather     brain  . Cancer Paternal Grandmother     breast  . Heart disease Paternal Grandfather   . Thyroid disease Neg Hx     BP 118/70 mmHg  Pulse 97  Temp(Src) 98 F (36.7 C) (Oral)  Ht 5\' 6"  (  1.676 m)  Wt 133 lb (60.328 kg)  BMI 21.48 kg/m2  SpO2 95%  LMP 09/03/2015  Review of Systems denies hair loss, muscle cramps, sob, weight gain, constipation, numbness, blurry vision, cold intolerance, myalgias, rhinorrhea, easy bruising, and syncope.  She has chronic cold intolerance, dry skin, and mild depression.      Objective:   Physical Exam VS: see vs page GEN: no distress HEAD: head: no deformity eyes: no periorbital swelling, no proptosis external nose and ears are normal mouth: no lesion seen NECK: supple, thyroid is not enlarged CHEST WALL: no deformity LUNGS:  Clear to auscultation CV: reg rate and rhythm, no murmur ABD: abdomen is soft, nontender.  no hepatosplenomegaly.  not distended.  no hernia.  MUSCULOSKELETAL: muscle bulk and strength are grossly normal.  no obvious joint swelling.  gait is normal and steady EXTEMITIES: no  deformity.  no ulcer on the feet.  feet are of normal color and temp.  no edema PULSES: dorsalis pedis intact bilat.  no carotid bruit NEURO:  cn 2-12 grossly intact.   readily moves all 4's.  sensation is intact to touch on the feet SKIN:  Normal texture and temperature.  No rash or suspicious lesion is visible.   NODES:  None palpable at the neck PSYCH: alert, well-oriented.  Does not appear anxious nor depressed.   Lab Results  Component Value Date   TSH 1.300 10/18/2015   TPO antibody is pos.  I have reviewed outside records, and summarized: Pt was noted to have abnormal TPO antibody, and referred here.  Lab Results  Component Value Date   HGBA1C 5.2 11/06/2015      Assessment & Plan:  Chronic thyroiditis, new to me.  She is euthyroid, but is at high risk for developing hypothyroidism in the future.  She is also at risk for other autoimmune conditions.    Patient is advised the following: Patient Instructions  You should have your thyroid and blood sugar checked at least once per year. I would be happy to see you back here as necessary.

## 2016-01-14 ENCOUNTER — Telehealth: Payer: Self-pay | Admitting: Primary Care

## 2016-01-14 NOTE — Telephone Encounter (Signed)
[name] E DOB: [birth year] Initial Comment Caller states she is having severe dizzinessnauseous and vomiting Nurse Assessment Nurse: Charna Elizabethrumbull, RN, Cathy Date/Time (Eastern Time): 01/14/2016 1:27:02 PM Confirm and document reason for call. If symptomatic, describe symptoms. You must click the next button to save text entered. ---Caller states she developed dizziness this morning and has vomited one time. She developed another Migraine headache 3 days ago. No injury in the past 3 days. No known fever. Alert and responsive. Has the patient traveled out of the country within the last 30 days? ---No Does the patient have any new or worsening symptoms? ---Yes Will a triage be completed? ---Yes Related visit to physician within the last 2 weeks? ---Yes Does the PT have any chronic conditions? (i.e. diabetes, asthma, etc.) ---Yes List chronic conditions. ---Hashimoto's, Allergies, Migraines Is the patient pregnant or possibly pregnant? (Ask all females between the ages of 1212-55) ---No Is this a behavioral health or substance abuse call? ---No Guidelines Guideline Title Affirmed Question Affirmed Notes Dizziness - Lightheadedness SEVERE dizziness (e.g., unable to stand, requires support to walk, feels like passing out now) Final Disposition User Go to ED Now (or PCP triage) Charna Elizabethrumbull, RN, EMCORCathy Referrals [facility] - ED Disagree/Comply: Comply

## 2016-01-14 NOTE — Telephone Encounter (Signed)
Noted and agree with disposition. 

## 2016-01-16 ENCOUNTER — Ambulatory Visit: Payer: BLUE CROSS/BLUE SHIELD | Admitting: Primary Care

## 2016-02-01 DIAGNOSIS — R05 Cough: Secondary | ICD-10-CM | POA: Diagnosis not present

## 2016-02-01 DIAGNOSIS — J189 Pneumonia, unspecified organism: Secondary | ICD-10-CM | POA: Diagnosis not present

## 2016-02-01 DIAGNOSIS — J00 Acute nasopharyngitis [common cold]: Secondary | ICD-10-CM | POA: Diagnosis not present

## 2016-02-09 ENCOUNTER — Emergency Department: Payer: BLUE CROSS/BLUE SHIELD

## 2016-02-09 ENCOUNTER — Encounter: Payer: Self-pay | Admitting: *Deleted

## 2016-02-09 ENCOUNTER — Emergency Department
Admission: EM | Admit: 2016-02-09 | Discharge: 2016-02-09 | Disposition: A | Discharge status: Home / Self Care | Payer: BLUE CROSS/BLUE SHIELD | Attending: Emergency Medicine | Admitting: Emergency Medicine

## 2016-02-09 DIAGNOSIS — Z3A01 Less than 8 weeks gestation of pregnancy: Secondary | ICD-10-CM | POA: Diagnosis not present

## 2016-02-09 DIAGNOSIS — O26899 Other specified pregnancy related conditions, unspecified trimester: Secondary | ICD-10-CM

## 2016-02-09 DIAGNOSIS — R102 Pelvic and perineal pain: Secondary | ICD-10-CM

## 2016-02-09 DIAGNOSIS — Z3490 Encounter for supervision of normal pregnancy, unspecified, unspecified trimester: Secondary | ICD-10-CM

## 2016-02-09 DIAGNOSIS — O26891 Other specified pregnancy related conditions, first trimester: Secondary | ICD-10-CM | POA: Diagnosis not present

## 2016-02-09 HISTORY — DX: Autoimmune thyroiditis: E06.3

## 2016-02-09 LAB — COMPREHENSIVE METABOLIC PANEL
ALBUMIN: 4.7 g/dL (ref 3.5–5.0)
ALT: 22 U/L (ref 14–54)
AST: 22 U/L (ref 15–41)
Alkaline Phosphatase: 48 U/L (ref 38–126)
Anion gap: 5 (ref 5–15)
BUN: 16 mg/dL (ref 6–20)
CALCIUM: 9.2 mg/dL (ref 8.9–10.3)
CHLORIDE: 110 mmol/L (ref 101–111)
CO2: 22 mmol/L (ref 22–32)
Creatinine, Ser: 0.76 mg/dL (ref 0.44–1.00)
GFR calc Af Amer: >60 mL/min (ref >60)
GFR calc non Af Amer: >60 mL/min (ref >60)
GLUCOSE: 104 mg/dL — AB (ref 65–99)
Potassium: 4 mmol/L (ref 3.5–5.1)
SODIUM: 137 mmol/L (ref 135–145)
TOTAL PROTEIN: 7.7 g/dL (ref 6.5–8.1)
Total Bilirubin: 0.7 mg/dL (ref 0.3–1.2)

## 2016-02-09 LAB — ABO/RH: ABO/RH(D): O POS

## 2016-02-09 LAB — CBC
HEMATOCRIT: 40.4 % (ref 35.0–47.0)
HEMOGLOBIN: 13.8 g/dL (ref 12.0–16.0)
MCH: 29.2 pg (ref 26.0–34.0)
MCHC: 34.2 g/dL (ref 32.0–36.0)
MCV: 85.3 fL (ref 80.0–100.0)
Platelets: 307 10*3/uL (ref 150–440)
RBC: 4.73 MIL/uL (ref 3.80–5.20)
RDW: 12 % (ref 11.5–14.5)
WBC: 9.9 10*3/uL (ref 3.6–11.0)

## 2016-02-09 LAB — LIPASE, BLOOD: LIPASE: 30 U/L (ref 11–51)

## 2016-02-09 LAB — URINALYSIS COMPLETE WITH MICROSCOPIC (ARMC ONLY)
Bacteria, UA: NONE SEEN
Bilirubin Urine: NEGATIVE
Glucose, UA: NEGATIVE mg/dL
HGB URINE DIPSTICK: NEGATIVE
KETONES UR: NEGATIVE mg/dL
LEUKOCYTES UA: NEGATIVE
Nitrite: NEGATIVE
PH: 6 (ref 5.0–8.0)
Protein, ur: NEGATIVE mg/dL
Specific Gravity, Urine: 1.016 (ref 1.005–1.030)

## 2016-02-09 LAB — HCG, QUANTITATIVE, PREGNANCY: hCG, Beta Chain, Quant, S: 2294 m[IU]/mL — ABNORMAL HIGH (ref <5)

## 2016-02-09 NOTE — ED Notes (Signed)
States abd cramping sharp in nature that began yesterday on and off, states some rectal pain sharp in nature, states it hurts to sit, denies any vaginal bleeding, states is she is about 5 weeks pregnet

## 2016-02-09 NOTE — ED Provider Notes (Signed)
[facility] Emergency Department Provider Note    ____________________________________________  Time seen: ~1520  I have reviewed the triage vital signs and the nursing notes.   HISTORY  Chief Complaint Abdominal Pain and Rectal Pain   History limited by: Not Limited   HPI [name] is a 35 y.o. female who presents to the emergency department today because of concerns for pelvic and rectal pain in the setting of early pregnancy. The patient thinks that she has roughly [redacted] weeks pregnant. She started having pelvic and rectal pain yesterday. She describes it as being located deep in the pelvis. She has not had any change in defecation. No bloody stools or diarrhea. She has not noticed any abnormal vaginal discharge including blood. The patient denies any fevers.   Past Medical History  Diagnosis Date  . PONV (postoperative nausea and vomiting)   . Migraines   . Seasonal allergies   . Hashimoto's disease     Patient Active Problem List   Diagnosis Date Noted  . Wellness examination 11/06/2015  . Hyperglycemia 11/06/2015  . Abnormal thyroid function test 05/01/2015  . Acute mastitis of right breast 12/20/2014  . Acute mastitis of left breast 11/09/2014  . Constipation 11/07/2014  . Migraine with aura 06/04/2014  . Derangement of lateral meniscus 12/21/2011  . Arthralgia of lower leg 11/20/2011  . Allergic rhinitis 10/13/2010    Past Surgical History  Procedure Laterality Date  . Knee arthroscopy Right 12/2011    Microfracture  . Knee arthroscopy Left 08/2008  . Tonsillectomy and adenoidectomy      Current Outpatient Rx  Name  Route  Sig  Dispense  Refill  . Butalbital-APAP-Caffeine 50-325-40 MG per capsule   Oral   Take 1-2 capsules by mouth every 6 (six) hours as needed for headache.   30 capsule   3   . Fenugreek 610 MG CAPS   Oral   Take 3 tablets by mouth 3 (three) times daily. Reported on 11/06/2015         . Misc.  Devices (BREAST PUMP) MISC   Does not apply   1 Device by Does not apply route daily. Patient not taking: Reported on 11/06/2015   1 each   0   . prenatal vitamin w/FE, FA (PRENATAL 1 + 1) 27-1 MG TABS tablet   Oral   Take 1 tablet by mouth daily at 12 noon.           Allergies Cephalosporins; Fentanyl; Oxycodone; and Penicillins  Family History  Problem Relation Age of Onset  . Cancer Mother     stomach  . Hypertension Father   . Diabetes Mellitus I Sister   . Cancer Maternal Grandfather     brain  . Cancer Paternal Grandmother     breast  . Heart disease Paternal Grandfather   . Thyroid disease Neg Hx     Social History Social History  Substance Use Topics  . Smoking status: Never Smoker   . Smokeless tobacco: Never Used  . Alcohol Use: No    Review of Systems  Constitutional: Negative for fever. Cardiovascular: Negative for chest pain. Respiratory: Negative for shortness of breath. Gastrointestinal: Positive for pelvic pain Neurological: Negative for headaches, focal weakness or numbness.   10-point ROS otherwise negative.  ____________________________________________   PHYSICAL EXAM:  VITAL SIGNS: ED Triage Vitals  Enc Vitals Group     BP 02/09/16 1310 118/99 mmHg     Pulse Rate 02/09/16 1310 103  Resp 02/09/16 1310 18     Temp 02/09/16 1310 98.6 F (37 C)     Temp Source 02/09/16 1310 Oral     SpO2 02/09/16 1310 97 %     Weight 02/09/16 1310 125 lb (56.7 kg)     Height 02/09/16 1310  (1.676 m)     Head Cir --      Peak Flow --      Pain Score 02/09/16 1311 3   Constitutional: Alert and oriented. Well appearing and in no distress. Eyes: Conjunctivae are normal. PERRL. Normal extraocular movements. ENT   Head: Normocephalic and atraumatic.   Nose: No congestion/rhinnorhea.   Mouth/Throat: Mucous membranes are moist.   Neck: No stridor. Hematological/Lymphatic/Immunilogical: No cervical  lymphadenopathy. Cardiovascular: Normal rate, regular rhythm.  No murmurs, rubs, or gallops. Respiratory: Normal respiratory effort without tachypnea nor retractions. Breath sounds are clear and equal bilaterally. No wheezes/rales/rhonchi. Gastrointestinal: Soft and nontender. No distention.  Genitourinary: Deferred Musculoskeletal: Normal range of motion in all extremities. No joint effusions.  No lower extremity tenderness nor edema. Neurologic:  Normal speech and language. No gross focal neurologic deficits are appreciated.  Skin:  Skin is warm, dry and intact. No rash noted. Psychiatric: Mood and affect are normal. Speech and behavior are normal. Patient exhibits appropriate insight and judgment.  ____________________________________________    LABS (pertinent positives/negatives)  Labs Reviewed  COMPREHENSIVE METABOLIC PANEL - Abnormal; Notable for the following:    Glucose, Bld 104 (*)    All other components within normal limits  HCG, QUANTITATIVE, PREGNANCY - Abnormal; Notable for the following:    hCG, Beta Chain, Quant, S 2294 (*)    All other components within normal limits  URINALYSIS COMPLETEWITH MICROSCOPIC ([facility] ONLY) - Abnormal; Notable for the following:    Color, Urine YELLOW (*)    APPearance CLEAR (*)    Squamous Epithelial / LPF 0-5 (*)    All other components within normal limits  LIPASE, BLOOD  CBC  ABO/RH     ____________________________________________   EKG  None  ____________________________________________    RADIOLOGY  US IMPRESSION: 1. No intrauterine gestational sac, although the endometrium is thickened. This may reflect an early intrauterine pregnancy. Recommend followup cereal beta HCG levels and repeat ultrasound in 07-10 days to assess for normal pregnancy progression. 2. No uterine masses. No endometrial fluid. Normal cervix. 3. Normal appearance of the ovaries. No evidence of ovarian torsion. 4. Small to moderate amount  pelvic free fluid somewhat greater than generally seen for physiologic free fluid. This could potentially be from a ruptured ovarian cyst.  ____________________________________________   PROCEDURES  Procedure(s) performed: None  Critical Care performed: No  ____________________________________________   INITIAL IMPRESSION / ASSESSMENT AND PLAN / ED COURSE  Pertinent labs & imaging results that were available during my care of the patient were reviewed by me and considered in my medical decision making (see chart for details).  Patient presented to the emergency department today because of concerns for pelvic and rectal pain in the setting of early pregnancy. Serum beta hCG 2300. Ultrasound was obtained which cannot directly visualize a pregnancy however the endometrium was thickened. Did show some pelvic free fluid perhaps secondary to an ovarian cyst. Patient was nontender on my exam. At this point given ultrasound findings I doubt ectopic pregnancy. I did have a discussion with the patient however that she will need to follow up with OB/GYN. Furthermore I did discuss return precautions. ____________________________________________   FINAL CLINICAL IMPRESSION(S) / ED  DIAGNOSES  Final diagnoses:  Pelvic pain in female  Early stage of pregnancy     Phineas SemenGraydon Shayleigh Bouldin, MD 02/09/16 [id number]

## 2016-02-09 NOTE — Discharge Instructions (Signed)
Please seek medical attention for any high fevers, chest pain, shortness of breath, change in behavior, persistent vomiting, bloody stool or any other new or concerning symptoms.   Abdominal Pain During Pregnancy Belly (abdominal) pain is common during pregnancy. Most of the time, it is not a serious problem. Other times, it can be a sign that something is wrong with the pregnancy. Always tell your doctor if you have belly pain. HOME CARE Monitor your belly pain for any changes. The following actions may help you feel better:  Do not have sex (intercourse) or put anything in your vagina until you feel better.  Rest until your pain stops.  Drink clear fluids if you feel sick to your stomach (nauseous). Do not eat solid food until you feel better.  Only take medicine as told by your doctor.  Keep all doctor visits as told. GET HELP RIGHT AWAY IF:   You are bleeding, leaking fluid, or pieces of tissue come out of your vagina.  You have more pain or cramping.  You keep throwing up (vomiting).  You have pain when you pee (urinate) or have blood in your pee.  You have a fever.  You do not feel your baby moving as much.  You feel very weak or feel like passing out.  You have trouble breathing, with or without belly pain.  You have a very bad headache and belly pain.  You have fluid leaking from your vagina and belly pain.  You keep having watery poop (diarrhea).  Your belly pain does not go away after resting, or the pain gets worse. MAKE SURE YOU:   Understand these instructions.  Will watch your condition.  Will get help right away if you are not doing well or get worse.   This information is not intended to replace advice given to you by your health care provider. Make sure you discuss any questions you have with your health care provider.   Document Released: 10/07/2009 Document Revised: 06/21/2013 Document Reviewed: 05/18/2013 Elsevier Interactive Patient Education  [employer]2016 [employer]

## 2016-02-09 NOTE — ED Notes (Signed)
Discussed discharge instructions and follow-up care with patient. No questions or concerns at this time. Pt stable at discharge.  

## 2016-02-13 ENCOUNTER — Emergency Department: Payer: BLUE CROSS/BLUE SHIELD | Admitting: Certified Registered Nurse Anesthetist

## 2016-02-13 ENCOUNTER — Encounter: Payer: Self-pay | Admitting: Emergency Medicine

## 2016-02-13 ENCOUNTER — Observation Stay
Admission: EM | Admit: 2016-02-13 | Discharge: 2016-02-14 | Disposition: A | Discharge status: Home / Self Care | Payer: BLUE CROSS/BLUE SHIELD | Attending: Obstetrics and Gynecology | Admitting: Obstetrics and Gynecology

## 2016-02-13 ENCOUNTER — Emergency Department: Payer: BLUE CROSS/BLUE SHIELD

## 2016-02-13 ENCOUNTER — Encounter
Admission: EM | Disposition: A | Discharge status: Home / Self Care | Payer: Self-pay | Source: Home / Self Care | Attending: Emergency Medicine

## 2016-02-13 DIAGNOSIS — G43109 Migraine with aura, not intractable, without status migrainosus: Secondary | ICD-10-CM | POA: Diagnosis not present

## 2016-02-13 DIAGNOSIS — Z880 Allergy status to penicillin: Secondary | ICD-10-CM | POA: Diagnosis not present

## 2016-02-13 DIAGNOSIS — I959 Hypotension, unspecified: Secondary | ICD-10-CM | POA: Insufficient documentation

## 2016-02-13 DIAGNOSIS — R1030 Lower abdominal pain, unspecified: Secondary | ICD-10-CM | POA: Diagnosis not present

## 2016-02-13 DIAGNOSIS — R1084 Generalized abdominal pain: Secondary | ICD-10-CM | POA: Insufficient documentation

## 2016-02-13 DIAGNOSIS — J309 Allergic rhinitis, unspecified: Secondary | ICD-10-CM | POA: Insufficient documentation

## 2016-02-13 DIAGNOSIS — O002 Ovarian pregnancy without intrauterine pregnancy: Principal | ICD-10-CM | POA: Insufficient documentation

## 2016-02-13 DIAGNOSIS — O009 Unspecified ectopic pregnancy without intrauterine pregnancy: Secondary | ICD-10-CM | POA: Diagnosis not present

## 2016-02-13 DIAGNOSIS — N838 Other noninflammatory disorders of ovary, fallopian tube and broad ligament: Secondary | ICD-10-CM | POA: Diagnosis not present

## 2016-02-13 DIAGNOSIS — Z888 Allergy status to other drugs, medicaments and biological substances status: Secondary | ICD-10-CM | POA: Insufficient documentation

## 2016-02-13 DIAGNOSIS — Z3A01 Less than 8 weeks gestation of pregnancy: Secondary | ICD-10-CM | POA: Insufficient documentation

## 2016-02-13 DIAGNOSIS — R739 Hyperglycemia, unspecified: Secondary | ICD-10-CM | POA: Diagnosis not present

## 2016-02-13 DIAGNOSIS — E063 Autoimmune thyroiditis: Secondary | ICD-10-CM | POA: Insufficient documentation

## 2016-02-13 DIAGNOSIS — R100 Acute abdomen: Secondary | ICD-10-CM | POA: Insufficient documentation

## 2016-02-13 DIAGNOSIS — K5900 Constipation, unspecified: Secondary | ICD-10-CM | POA: Diagnosis not present

## 2016-02-13 DIAGNOSIS — O00209 Unspecified ovarian pregnancy without intrauterine pregnancy: Secondary | ICD-10-CM | POA: Diagnosis present

## 2016-02-13 DIAGNOSIS — O26891 Other specified pregnancy related conditions, first trimester: Secondary | ICD-10-CM | POA: Diagnosis not present

## 2016-02-13 DIAGNOSIS — K661 Hemoperitoneum: Secondary | ICD-10-CM | POA: Diagnosis not present

## 2016-02-13 DIAGNOSIS — Z885 Allergy status to narcotic agent status: Secondary | ICD-10-CM | POA: Diagnosis not present

## 2016-02-13 DIAGNOSIS — O008 Other ectopic pregnancy without intrauterine pregnancy: Secondary | ICD-10-CM | POA: Diagnosis not present

## 2016-02-13 HISTORY — PX: LAPAROSCOPIC UNILATERAL SALPINGECTOMY: SHX5934

## 2016-02-13 HISTORY — PX: LAPAROSCOPY: SHX197

## 2016-02-13 LAB — CBC
HEMATOCRIT: 28.4 % — AB (ref 35.0–47.0)
Hemoglobin: 9.5 g/dL — ABNORMAL LOW (ref 12.0–16.0)
MCH: 29 pg (ref 26.0–34.0)
MCHC: 33.6 g/dL (ref 32.0–36.0)
MCV: 86.2 fL (ref 80.0–100.0)
Platelets: 210 10*3/uL (ref 150–440)
RBC: 3.29 MIL/uL — ABNORMAL LOW (ref 3.80–5.20)
RDW: 12.1 % (ref 11.5–14.5)
WBC: 11.1 10*3/uL — ABNORMAL HIGH (ref 3.6–11.0)

## 2016-02-13 LAB — COMPREHENSIVE METABOLIC PANEL
ALBUMIN: 4.7 g/dL (ref 3.5–5.0)
ALK PHOS: 47 U/L (ref 38–126)
ALT: 22 U/L (ref 14–54)
AST: 26 U/L (ref 15–41)
Anion gap: 7 (ref 5–15)
BUN: 19 mg/dL (ref 6–20)
CO2: 19 mmol/L — AB (ref 22–32)
Calcium: 9.3 mg/dL (ref 8.9–10.3)
Chloride: 108 mmol/L (ref 101–111)
Creatinine, Ser: 0.71 mg/dL (ref 0.44–1.00)
GFR calc Af Amer: >60 mL/min (ref >60)
GFR calc non Af Amer: >60 mL/min (ref >60)
GLUCOSE: 113 mg/dL — AB (ref 65–99)
POTASSIUM: 4.4 mmol/L (ref 3.5–5.1)
Sodium: 134 mmol/L — ABNORMAL LOW (ref 135–145)
TOTAL PROTEIN: 7.8 g/dL (ref 6.5–8.1)
Total Bilirubin: 1.1 mg/dL (ref 0.3–1.2)

## 2016-02-13 LAB — CBC WITH DIFFERENTIAL/PLATELET
BASOS PCT: 1 %
Basophils Absolute: 0 10*3/uL (ref 0–0.1)
Eosinophils Absolute: 0 10*3/uL (ref 0–0.7)
Eosinophils Relative: 1 %
HEMATOCRIT: 40.2 % (ref 35.0–47.0)
HEMOGLOBIN: 13.9 g/dL (ref 12.0–16.0)
LYMPHS ABS: 1.2 10*3/uL (ref 1.0–3.6)
LYMPHS PCT: 21 %
MCH: 29.8 pg (ref 26.0–34.0)
MCHC: 34.6 g/dL (ref 32.0–36.0)
MCV: 86.3 fL (ref 80.0–100.0)
MONO ABS: 0.3 10*3/uL (ref 0.2–0.9)
MONOS PCT: 5 %
NEUTROS ABS: 4.3 10*3/uL (ref 1.4–6.5)
Neutrophils Relative %: 74 %
Platelets: 280 10*3/uL (ref 150–440)
RBC: 4.66 MIL/uL (ref 3.80–5.20)
RDW: 12.1 % (ref 11.5–14.5)
WBC: 5.9 10*3/uL (ref 3.6–11.0)

## 2016-02-13 LAB — HCG, QUANTITATIVE, PREGNANCY: HCG, BETA CHAIN, QUANT, S: 7074 m[IU]/mL — AB (ref <5)

## 2016-02-13 LAB — URINALYSIS COMPLETE WITH MICROSCOPIC (ARMC ONLY)
BACTERIA UA: NONE SEEN
BILIRUBIN URINE: NEGATIVE
Glucose, UA: NEGATIVE mg/dL
Hgb urine dipstick: NEGATIVE
Leukocytes, UA: NEGATIVE
Nitrite: NEGATIVE
PH: 9 — AB (ref 5.0–8.0)
Protein, ur: 30 mg/dL — AB
SQUAMOUS EPITHELIAL / LPF: NONE SEEN
Specific Gravity, Urine: 1.02 (ref 1.005–1.030)

## 2016-02-13 LAB — LIPASE, BLOOD: Lipase: 29 U/L (ref 11–51)

## 2016-02-13 LAB — PROTIME-INR
INR: 1.16
Prothrombin Time: 15 seconds (ref 11.4–15.0)

## 2016-02-13 LAB — PREPARE RBC (CROSSMATCH)

## 2016-02-13 SURGERY — LAPAROSCOPY, DIAGNOSTIC
Anesthesia: General | Laterality: Right

## 2016-02-13 SURGERY — Surgical Case
Anesthesia: *Unknown

## 2016-02-13 MED ORDER — ONDANSETRON HCL 4 MG PO TABS
4.0000 mg | ORAL_TABLET | Freq: Four times a day (QID) | ORAL | Status: DC | PRN
Start: 2016-02-13 — End: 2016-02-14

## 2016-02-13 MED ORDER — ONDANSETRON HCL 4 MG/2ML IJ SOLN
4.0000 mg | Freq: Once | INTRAMUSCULAR | Status: AC
  Administered 2016-02-13: 4 mg via INTRAVENOUS

## 2016-02-13 MED ORDER — LIDOCAINE HCL (CARDIAC) 20 MG/ML IV SOLN
INTRAVENOUS | Status: DC | PRN
  Administered 2016-02-13: 30 mg via INTRAVENOUS

## 2016-02-13 MED ORDER — GLYCOPYRROLATE 0.2 MG/ML IJ SOLN
INTRAMUSCULAR | Status: DC | PRN
  Administered 2016-02-13: 0.6 mg via INTRAVENOUS

## 2016-02-13 MED ORDER — ESMOLOL HCL 100 MG/10ML IV SOLN
INTRAVENOUS | Status: DC | PRN
  Administered 2016-02-13: 10 mg via INTRAVENOUS
  Administered 2016-02-13: 20 mg via INTRAVENOUS

## 2016-02-13 MED ORDER — SODIUM CHLORIDE 0.9 % IV BOLUS (SEPSIS)
1000.0000 mL | Freq: Once | INTRAVENOUS | Status: AC
  Administered 2016-02-13 (×4): via INTRAVENOUS
  Administered 2016-02-13: 1000 mL via INTRAVENOUS

## 2016-02-13 MED ORDER — PROGESTERONE MICRONIZED 200 MG PO CAPS
200.0000 mg | ORAL_CAPSULE | Freq: Every day | ORAL | Status: DC
  Filled 2016-02-13: qty 1

## 2016-02-13 MED ORDER — ACETAMINOPHEN 10 MG/ML IV SOLN
500.0000 mg | INTRAVENOUS | Status: AC
  Administered 2016-02-13: 500 mg via INTRAVENOUS
  Filled 2016-02-13: qty 50

## 2016-02-13 MED ORDER — NEOSTIGMINE METHYLSULFATE 10 MG/10ML IV SOLN
INTRAVENOUS | Status: DC | PRN
  Administered 2016-02-13: 3 mg via INTRAVENOUS

## 2016-02-13 MED ORDER — IBUPROFEN 800 MG PO TABS
800.0000 mg | ORAL_TABLET | Freq: Three times a day (TID) | ORAL | Status: DC | PRN
  Administered 2016-02-14: 800 mg via ORAL
  Filled 2016-02-13: qty 1

## 2016-02-13 MED ORDER — HYDROMORPHONE HCL 1 MG/ML IJ SOLN
INTRAMUSCULAR | Status: AC
  Administered 2016-02-13: 1 mg via INTRAVENOUS
  Filled 2016-02-13: qty 1

## 2016-02-13 MED ORDER — ALUM & MAG HYDROXIDE-SIMETH 200-200-20 MG/5ML PO SUSP
30.0000 mL | Freq: Once | ORAL | Status: AC
  Administered 2016-02-13: 30 mL via ORAL
  Filled 2016-02-13: qty 30

## 2016-02-13 MED ORDER — BUPIVACAINE HCL 0.5 % IJ SOLN
INTRAMUSCULAR | Status: DC | PRN
  Administered 2016-02-13: 5 mL

## 2016-02-13 MED ORDER — FAMOTIDINE 20 MG PO TABS
40.0000 mg | ORAL_TABLET | Freq: Once | ORAL | Status: AC
  Administered 2016-02-13: 40 mg via ORAL
  Filled 2016-02-13: qty 2

## 2016-02-13 MED ORDER — HYDROCODONE-ACETAMINOPHEN 5-325 MG PO TABS
1.0000 | ORAL_TABLET | ORAL | Status: DC | PRN
  Administered 2016-02-14 (×3): 1 via ORAL
  Filled 2016-02-13 (×3): qty 1

## 2016-02-13 MED ORDER — ONDANSETRON HCL 4 MG/2ML IJ SOLN
4.0000 mg | Freq: Once | INTRAMUSCULAR | Status: AC | PRN
  Administered 2016-02-13: 4 mg via INTRAVENOUS

## 2016-02-13 MED ORDER — MORPHINE SULFATE (PF) 2 MG/ML IV SOLN
1.0000 mg | INTRAVENOUS | Status: DC | PRN
  Administered 2016-02-13 – 2016-02-14 (×2): 1 mg via INTRAVENOUS
  Filled 2016-02-13 (×2): qty 1

## 2016-02-13 MED ORDER — PENTAFLUOROPROP-TETRAFLUOROETH EX AERO
INHALATION_SPRAY | CUTANEOUS | Status: AC
  Administered 2016-02-13: 30
  Filled 2016-02-13: qty 30

## 2016-02-13 MED ORDER — LACTATED RINGERS IV SOLN
INTRAVENOUS | Status: DC | PRN
  Administered 2016-02-13: 14:00:00 via INTRAVENOUS

## 2016-02-13 MED ORDER — HYDROMORPHONE HCL 1 MG/ML IJ SOLN
1.0000 mg | Freq: Once | INTRAMUSCULAR | Status: AC
Start: 2016-02-13 — End: 2016-02-13
  Administered 2016-02-13: 1 mg via INTRAVENOUS

## 2016-02-13 MED ORDER — MORPHINE SULFATE (PF) 4 MG/ML IV SOLN
INTRAVENOUS | Status: AC
  Administered 2016-02-13: 4 mg via INTRAVENOUS
  Filled 2016-02-13: qty 1

## 2016-02-13 MED ORDER — LACTATED RINGERS IV SOLN
INTRAVENOUS | Status: DC
  Administered 2016-02-14 (×2): via INTRAVENOUS

## 2016-02-13 MED ORDER — PROGESTERONE 200 MG VA SUPP
200.0000 mg | Freq: Every day | VAGINAL | Status: DC
  Filled 2016-02-13: qty 1

## 2016-02-13 MED ORDER — ONDANSETRON HCL 4 MG/2ML IJ SOLN
INTRAMUSCULAR | Status: DC | PRN
  Administered 2016-02-13: 4 mg via INTRAVENOUS

## 2016-02-13 MED ORDER — LACTATED RINGERS IV SOLN
INTRAVENOUS | Status: DC
  Administered 2016-02-13: 17:00:00 via INTRAVENOUS

## 2016-02-13 MED ORDER — ALFENTANIL 500 MCG/ML IJ INJ
INJECTION | INTRAMUSCULAR | Status: DC | PRN
  Administered 2016-02-13: 250 ug via INTRAVENOUS
  Administered 2016-02-13: 500 ug via INTRAVENOUS
  Administered 2016-02-13: 250 ug via INTRAVENOUS

## 2016-02-13 MED ORDER — LACTATED RINGERS IV SOLN
INTRAVENOUS | Status: DC
  Administered 2016-02-13: 21:00:00 via INTRAVENOUS

## 2016-02-13 MED ORDER — SODIUM CHLORIDE 0.9 % IV SOLN: Freq: Once | INTRAVENOUS | Status: DC

## 2016-02-13 MED ORDER — DIPHENHYDRAMINE HCL 50 MG/ML IJ SOLN
25.0000 mg | INTRAMUSCULAR | Status: AC
  Administered 2016-02-13: 25 mg via INTRAVENOUS
  Filled 2016-02-13: qty 1

## 2016-02-13 MED ORDER — SCOPOLAMINE 1 MG/3DAYS TD PT72
1.0000 | MEDICATED_PATCH | Freq: Once | TRANSDERMAL | Status: DC
  Administered 2016-02-13: 1.5 mg via TRANSDERMAL

## 2016-02-13 MED ORDER — PROPOFOL 10 MG/ML IV BOLUS
INTRAVENOUS | Status: DC | PRN
  Administered 2016-02-13: 100 mg via INTRAVENOUS

## 2016-02-13 MED ORDER — ONDANSETRON HCL 4 MG/2ML IJ SOLN
INTRAMUSCULAR | Status: AC
  Administered 2016-02-13: 4 mg via INTRAVENOUS
  Filled 2016-02-13: qty 2

## 2016-02-13 MED ORDER — ROCURONIUM BROMIDE 100 MG/10ML IV SOLN
INTRAVENOUS | Status: DC | PRN
  Administered 2016-02-13 (×2): 10 mg via INTRAVENOUS
  Administered 2016-02-13: 40 mg via INTRAVENOUS

## 2016-02-13 MED ORDER — MORPHINE SULFATE (PF) 4 MG/ML IV SOLN
4.0000 mg | Freq: Once | INTRAVENOUS | Status: AC
  Administered 2016-02-13: 4 mg via INTRAVENOUS

## 2016-02-13 MED ORDER — ONDANSETRON HCL 4 MG/2ML IJ SOLN
INTRAMUSCULAR | Status: AC
  Filled 2016-02-13: qty 2

## 2016-02-13 MED ORDER — HYDROMORPHONE HCL 1 MG/ML IJ SOLN
0.2500 mg | INTRAMUSCULAR | Status: DC | PRN
  Administered 2016-02-13 (×2): 0.5 mg via INTRAVENOUS

## 2016-02-13 MED ORDER — MIDAZOLAM HCL 2 MG/2ML IJ SOLN
INTRAMUSCULAR | Status: DC | PRN
  Administered 2016-02-13 (×2): 1 mg via INTRAVENOUS

## 2016-02-13 MED ORDER — SCOPOLAMINE 1 MG/3DAYS TD PT72
MEDICATED_PATCH | TRANSDERMAL | Status: AC
  Administered 2016-02-13: 1.5 mg via TRANSDERMAL
  Filled 2016-02-13: qty 1

## 2016-02-13 MED ORDER — SODIUM CHLORIDE 0.9 % IV SOLN
Freq: Once | INTRAVENOUS | Status: AC
Start: 2016-02-13 — End: 2016-02-13
  Administered 2016-02-13: 21:00:00 via INTRAVENOUS

## 2016-02-13 MED ORDER — ONDANSETRON HCL 4 MG/2ML IJ SOLN
4.0000 mg | Freq: Four times a day (QID) | INTRAMUSCULAR | Status: DC | PRN
  Administered 2016-02-13 – 2016-02-14 (×4): 4 mg via INTRAVENOUS
  Filled 2016-02-13 (×4): qty 2

## 2016-02-13 MED ORDER — PHENYLEPHRINE HCL 10 MG/ML IJ SOLN
INTRAMUSCULAR | Status: DC | PRN
  Administered 2016-02-13: 100 ug via INTRAVENOUS

## 2016-02-13 MED ORDER — SODIUM CHLORIDE 0.9 % IV SOLN
Freq: Once | INTRAVENOUS | Status: AC
  Administered 2016-02-13: 11:00:00 via INTRAVENOUS

## 2016-02-13 MED ORDER — HYDROMORPHONE HCL 1 MG/ML IJ SOLN
INTRAMUSCULAR | Status: AC
Start: 2016-02-13 — End: 2016-02-13
  Administered 2016-02-13: 0.5 mg via INTRAVENOUS
  Filled 2016-02-13: qty 1

## 2016-02-13 MED ORDER — EPHEDRINE SULFATE 50 MG/ML IJ SOLN
INTRAMUSCULAR | Status: DC | PRN
  Administered 2016-02-13: 10 mg via INTRAVENOUS

## 2016-02-13 MED ORDER — PROGESTERONE MICRONIZED 200 MG PO CAPS
200.0000 mg | ORAL_CAPSULE | Freq: Every day | ORAL | Status: DC
  Administered 2016-02-13 – 2016-02-14 (×2): 200 mg via VAGINAL
  Filled 2016-02-13 (×2): qty 1

## 2016-02-13 SURGICAL SUPPLY — 40 items
ANCHOR TIS RET SYS 235ML (MISCELLANEOUS) ×3 IMPLANT
BAG URO DRAIN 2000ML W/SPOUT (MISCELLANEOUS) IMPLANT
BLADE SURG SZ11 CARB STEEL (BLADE) ×3 IMPLANT
CANISTER SUCT 1200ML W/VALVE (MISCELLANEOUS) ×3 IMPLANT
CATH FOLEY 2WAY  5CC 16FR (CATHETERS)
CATH ROBINSON RED A/P 16FR (CATHETERS) ×3 IMPLANT
CATH URTH 16FR FL 2W BLN LF (CATHETERS) IMPLANT
CHLORAPREP W/TINT 26ML (MISCELLANEOUS) ×3 IMPLANT
DRSG TEGADERM 2-3/8X2-3/4 SM (GAUZE/BANDAGES/DRESSINGS) ×9 IMPLANT
ENDOPOUCH RETRIEVER 10 (MISCELLANEOUS) ×3 IMPLANT
GAUZE SPONGE NON-WVN 2X2 STRL (MISCELLANEOUS) ×4 IMPLANT
GLOVE BIO SURGEON STRL SZ8 (GLOVE) ×6 IMPLANT
GOWN STRL REUS W/ TWL LRG LVL3 (GOWN DISPOSABLE) ×4 IMPLANT
GOWN STRL REUS W/ TWL XL LVL3 (GOWN DISPOSABLE) ×2 IMPLANT
GOWN STRL REUS W/TWL LRG LVL3 (GOWN DISPOSABLE) ×2
GOWN STRL REUS W/TWL XL LVL3 (GOWN DISPOSABLE) ×1
IRRIGATION STRYKERFLOW (MISCELLANEOUS) ×2 IMPLANT
IRRIGATOR STRYKERFLOW (MISCELLANEOUS) ×3
IV NS 1000ML (IV SOLUTION) ×1
IV NS 1000ML BAXH (IV SOLUTION) ×2 IMPLANT
KIT RM TURNOVER CYSTO AR (KITS) ×3 IMPLANT
LABEL OR SOLS (LABEL) ×3 IMPLANT
NS IRRIG 500ML POUR BTL (IV SOLUTION) ×3 IMPLANT
PACK GYN LAPAROSCOPIC (MISCELLANEOUS) ×3 IMPLANT
PAD OB MATERNITY 4.3X12.25 (PERSONAL CARE ITEMS) ×3 IMPLANT
PAD PREP 24X41 OB/GYN DISP (PERSONAL CARE ITEMS) ×3 IMPLANT
SCISSORS METZENBAUM CVD 33 (INSTRUMENTS) ×3 IMPLANT
SHEARS HARMONIC ACE PLUS 36CM (ENDOMECHANICALS) ×3 IMPLANT
SLEEVE ENDOPATH XCEL 5M (ENDOMECHANICALS) ×3 IMPLANT
SPONGE VERSALON 2X2 STRL (MISCELLANEOUS) ×2
STRIP CLOSURE SKIN 1/2X4 (GAUZE/BANDAGES/DRESSINGS) ×3 IMPLANT
STRIP CLOSURE SKIN 1/4X4 (GAUZE/BANDAGES/DRESSINGS) ×3 IMPLANT
SUT VIC AB 2-0 UR6 27 (SUTURE) ×9 IMPLANT
SUT VIC AB 4-0 SH 27 (SUTURE) ×2
SUT VIC AB 4-0 SH 27XANBCTRL (SUTURE) ×4 IMPLANT
SWABSTK COMLB BENZOIN TINCTURE (MISCELLANEOUS) ×3 IMPLANT
TROCAR ENDO BLADELESS 11MM (ENDOMECHANICALS) ×3 IMPLANT
TROCAR XCEL NON-BLD 5MMX100MML (ENDOMECHANICALS) ×3 IMPLANT
TROCAR XCEL UNIV SLVE 11M 100M (ENDOMECHANICALS) ×3 IMPLANT
TUBING INSUFFLATOR HI FLOW (MISCELLANEOUS) ×3 IMPLANT

## 2016-02-13 NOTE — ED Notes (Signed)
Pt to ultarsound

## 2016-02-13 NOTE — ED Provider Notes (Signed)
[facility] Emergency Department Provider Note  ____________________________________________  Time seen: 9:00 AM  I have reviewed the triage vital signs and the nursing notes.   HISTORY  Chief Complaint Abdominal Pain    HPI [name] is a 35 y.o. female who complains of generalized abdominal pain started last night. She was seen in the emergency department about 5 days ago for abdominal pain, approximately [redacted] weeks pregnant at that time without establish prenatal care. She had no vaginal bleeding or leakage of fluids. Workup and exam were unremarkable except for a small amount of free fluid in the pelvis. Patient reports a pelvic exam was not done at that time but she is not having vaginal discharge.  In the intervening time, her pain seemed to dissipate and she was feeling well. However, last night pain returned and is severe. No vomiting or diarrhea but she is nauseated. No chest pain or shortness of breath. No fevers or chills dizziness or syncope. Still no vaginal bleeding or discharge or contractions. Pain is generalized in the abdomen but radiates to the anus.     Past Medical History  Diagnosis Date  . PONV (postoperative nausea and vomiting)   . Migraines   . Seasonal allergies   . Hashimoto's disease      Patient Active Problem List   Diagnosis Date Noted  . Wellness examination 11/06/2015  . Hyperglycemia 11/06/2015  . Abnormal thyroid function test 05/01/2015  . Acute mastitis of right breast 12/20/2014  . Acute mastitis of left breast 11/09/2014  . Constipation 11/07/2014  . Migraine with aura 06/04/2014  . Derangement of lateral meniscus 12/21/2011  . Arthralgia of lower leg 11/20/2011  . Allergic rhinitis 10/13/2010     Past Surgical History  Procedure Laterality Date  . Knee arthroscopy Right 12/2011    Microfracture  . Knee arthroscopy Left 08/2008  . Tonsillectomy and adenoidectomy       Current Outpatient Rx   Name  Route  Sig  Dispense  Refill  . Butalbital-APAP-Caffeine 50-325-40 MG per capsule   Oral   Take 1-2 capsules by mouth every 6 (six) hours as needed for headache.   30 capsule   3   . Fenugreek 610 MG CAPS   Oral   Take 3 tablets by mouth 3 (three) times daily. Reported on 11/06/2015         . Misc. Devices (BREAST PUMP) MISC   Does not apply   1 Device by Does not apply route daily. Patient not taking: Reported on 11/06/2015   1 each   0   . prenatal vitamin w/FE, FA (PRENATAL 1 + 1) 27-1 MG TABS tablet   Oral   Take 1 tablet by mouth daily at 12 noon.            Allergies Cephalosporins; Fentanyl; Gluten meal; Oxycodone; and Penicillins   Family History  Problem Relation Age of Onset  . Cancer Mother     stomach  . Hypertension Father   . Diabetes Mellitus I Sister   . Cancer Maternal Grandfather     brain  . Cancer Paternal Grandmother     breast  . Heart disease Paternal Grandfather   . Thyroid disease Neg Hx     Social History Social History  Substance Use Topics  . Smoking status: Never Smoker   . Smokeless tobacco: Never Used  . Alcohol Use: No    Review of Systems  Constitutional:   No fever or chills.  Eyes:   No vision changes.  ENT:   No sore throat. No rhinorrhea. Cardiovascular:   No chest pain. Respiratory:   No dyspnea or cough. Gastrointestinal:   Generalized abdominal pain without vomiting and diarrhea.  No bloody stool. Genitourinary:   Negative for dysuria or difficulty urinating. Musculoskeletal:   Negative for focal pain or swelling Neurological:   Negative for headaches 10-point ROS otherwise negative.  ____________________________________________   PHYSICAL EXAM:  VITAL SIGNS: ED Triage Vitals  Enc Vitals Group     BP 02/13/16 0900 110/65 mmHg     Pulse Rate 02/13/16 0854 100     Resp 02/13/16 0854 18     Temp 02/13/16 0854 97.4 F (36.3 C)     Temp Source 02/13/16 0854 Oral     SpO2 02/13/16 0854 100 %      Weight 02/13/16 0854 125 lb (56.7 kg)     Height 02/13/16 0854  (1.676 m)     Head Cir --      Peak Flow --      Pain Score 02/13/16 0855 9     Pain Loc --      Pain Edu? --      Excl. in GC? --     Vital signs reviewed, nursing assessments reviewed.   Constitutional:   Alert and oriented. Moderate distress due to severe pain. Eyes:   No scleral icterus. No conjunctival pallor. PERRL. EOMI ENT   Head:   Normocephalic and atraumatic.   Nose:   No congestion/rhinnorhea. No septal hematoma   Mouth/Throat:   MMM, no pharyngeal erythema. No peritonsillar mass.    Neck:   No stridor. No SubQ emphysema. No meningismus. Hematological/Lymphatic/Immunilogical:   No cervical lymphadenopathy. Cardiovascular:   RRR. Symmetric bilateral radial and DP pulses.  No murmurs.  Respiratory:   Normal respiratory effort without tachypnea nor retractions. Breath sounds are clear and equal bilaterally. No wheezes/rales/rhonchi. Gastrointestinal:   Generalized tenderness, very severe with even mild palpation in the bilateral lower quadrants. Mild guarding. No rigidity. Non distended. There is no CVA tenderness.   Genitourinary:   deferred Musculoskeletal:   Nontender with normal range of motion in all extremities. No joint effusions.  No lower extremity tenderness.  No edema. Neurologic:   Normal speech and language.  CN 2-10 normal. Motor grossly intact. No gross focal neurologic deficits are appreciated.  Skin:    Skin is warm, dry and intact. No rash noted.  No petechiae, purpura, or bullae.  ____________________________________________    LABS (pertinent positives/negatives) (all labs ordered are listed, but only abnormal results are displayed) Labs Reviewed  COMPREHENSIVE METABOLIC PANEL - Abnormal; Notable for the following:    Sodium 134 (*)    CO2 19 (*)    Glucose, Bld 113 (*)    All other components within normal limits  URINALYSIS COMPLETEWITH MICROSCOPIC ([facility] ONLY) -  Abnormal; Notable for the following:    Color, Urine YELLOW (*)    APPearance CLEAR (*)    Ketones, ur 2+ (*)    pH 9.0 (*)    Protein, ur 30 (*)    All other components within normal limits  URINE CULTURE  CHLAMYDIA/NGC RT PCR ([facility] ONLY)  WET PREP, GENITAL  LIPASE, BLOOD  CBC WITH DIFFERENTIAL/PLATELET  HCG, QUANTITATIVE, PREGNANCY  PROTIME-INR  TYPE AND SCREEN   ____________________________________________   EKG    ____________________________________________    RADIOLOGY  Ultrasound pelvis reviewed by me, radiology interpretation reviewed, discussed with radiologist. Consistent with  ruptured ectopic pregnancy in the right adnexa  ____________________________________________   PROCEDURES  CRITICAL CARE Performed by: Scotty CourtSTAFFORD, Maryhelen Lindler   Total critical care time: 35 minutes  Critical care time was exclusive of separately billable procedures and treating other patients.  Critical care was necessary to treat or prevent imminent or life-threatening deterioration.  Critical care was time spent personally by me on the following activities: development of treatment plan with patient and/or surrogate as well as nursing, discussions with consultants, evaluation of patient's response to treatment, examination of patient, obtaining history from patient or surrogate, ordering and performing treatments and interventions, ordering and review of laboratory studies, ordering and review of radiographic studies, pulse oximetry and re-evaluation of patient's condition.  ____________________________________________   INITIAL IMPRESSION / ASSESSMENT AND PLAN / ED COURSE  Pertinent labs & imaging results that were available during my care of the patient were reviewed by me and considered in my medical decision making (see chart for details).  Patient presents with generalized abdominal pain with severe lower abdominal tenderness and some guarding. In the setting of [redacted]  weeks pregnant, although she recently had a fairly unrevealing ultrasound, we will repeat the ultrasound today. I'll review the prior results as well. IV morphine and Zofran for symptom control. IV fluids for likely dehydration  ----------------------------------------- 12:31 PM on 02/13/2016 ----------------------------------------- Since initial evaluation of patient's continued with severe pain, not much relief with the IV morphine. She was taken for repeat ultrasound very quickly where she had difficulty tolerating the exam because of the pain. Just prior to going to ultrasound her blood pressure did decrease to 80/55 but then increased again with IV fluids to about 100/60. She was given additional IV Dilaudid while at ultrasound due to her severe pain so that the examination could be completed. In the meantime, I discussed the prior ultrasound with radiology Dr. Oleta Mousermond at 1140 who agrees that the current ultrasound does appear to show an ectopic pregnancy although was not one was not visualized on the previous. Discussed the case with Dr. Feliberto GottronSchermerhorn at 12:00 who evaluated the patient in the ED right away. Patient is still in severe pain. Blood pressure remains adequate at about 105/60 with continuous IV fluid infusions.  Patient is planned for operative management. We'll continue to monitor the patient's hemodynamics here in the emergency department until she can be transported to the OR.      ____________________________________________   FINAL CLINICAL IMPRESSION(S) / ED DIAGNOSES  Final diagnoses:  Ruptured ectopic pregnancy  Generalized abdominal pain     Portions of this note were generated with dragon dictation software. Dictation errors may occur despite best attempts at proofreading.   Sharman CheekPhillip Yoshiharu Brassell, MD 02/13/16 [phone]

## 2016-02-13 NOTE — Progress Notes (Addendum)
Patient ID: Shannon HeadMegan Spraw Doyle, female   DOB: 1981-08-20, 35 y.o.   MRN: [id number] 4 hrs POST op . Hypotension . Mentating well. 97/55 p113 No urine output yet  A; ectopic s/p RSO  P: I+O tonight  Repeat hct in am  DR. HAlfon is aware and will manage  starting in am.

## 2016-02-13 NOTE — Discharge Summary (Signed)
Physician Discharge Summary  Patient ID: [name] MRN: [id number] DOB/AGE: [birth year] 37 y.o.  Admit date: 02/13/2016 Discharge date: 02/13/2016  Admission Diagnoses: right  tubal ectopic  Discharge Diagnoses: Right ovarian ectopic , hemoperitoneum  Active Problems:   Ectopic pregnancy of ovary   Discharged Condition: good  Hospital Course: pt was admitted with an acute abdomen and underwent a L/S which showed what appeared to be an right ovarian ectopic . 650 cc blood loss . Relative hypotension .3 hr post op hct from 40%--> 28%. Severe hypotension when sitting on toilet . Supine stabilized again . 2 unit blood transfusion given .  POD#1: Ambulated, tolerated PO. Pain well controlled. Final path c/w ovarian ectopic. Home with norco.    Consults: None  Significant Diagnostic Studies:  Treatments: surgery: as above L/S RSO  Discharge Exam: Blood pressure 97/55, pulse 113, temperature 98.1 F (36.7 C), temperature source Oral, resp. rate 15, height 5\' 6"  (1.676 m), weight 125 lb (56.7 kg), last menstrual period 01/01/2016, SpO2 100 %, currently breastfeeding. General appearance: alert, cooperative and mild distress Head: Normocephalic, without obvious abnormality, atraumatic Extremities: extremities normal, atraumatic, no cyanosis or edema Incision/Wound: three lapx incisions healing well ABD: soft, appropriately tender  Disposition: 01-Home or Self Care    CBC Latest Ref Rng 02/14/2016 02/14/2016 02/13/2016  WBC 3.6 - 11.0 K/uL 8.0 9.3 11.1(H)  Hemoglobin 12.0 - 16.0 g/dL 10.9(L) 10.8(L) 9.5(L)  Hematocrit 35.0 - 47.0 % 31.2(L) 30.7(L) 28.4(L)  Platelets 150 - 440 K/uL 177 172 210        Medication List    ASK your doctor about these medications        Breast Pump Misc  1 Device by Does not apply route daily.     Butalbital-APAP-Caffeine 50-325-40 MG capsule  Take 1-2 capsules by mouth every 6 (six) hours as needed for headache.     Fenugreek 610 MG Caps   Take 3 tablets by mouth 3 (three) times daily. Reported on 11/06/2015     prenatal vitamin w/FE, FA 27-1 MG Tabs tablet  Take 1 tablet by mouth daily at 12 noon.         Signed: SCHERMERHORN,THOMAS 02/13/2016, 7:35 PM

## 2016-02-13 NOTE — Brief Op Note (Signed)
02/13/2016  3:11 PM  PATIENT:  [name]  [age] y.o. female  PRE-OPERATIVE DIAGNOSIS: right tubal ectopic  POST-OPERATIVE DIAGNOSIS: right ovarian ectopic pregnancy, active bleeding , 600 cc hemoperitoneum   PROCEDURE:  Procedure(s): LAPAROSCOPY DIAGNOSTIC (N/A) LAPAROSCOPIC right salpingooophorectomy (Right)  SURGEON:  Surgeon(s) and Role:    Suzy Bouchard* Kelli Robeck J Tyrek Lawhorn, MD - Primary  PHYSICIAN ASSISTANT:   ASSISTANTS: [place] , pa student    ANESTHESIA:   general  EBL:  650 cc , 50 cc during case IVF 1500 BLOOD ADMINISTERED:none  DRAINS: none   LOCAL MEDICATIONS USED:  MARCAINE     SPECIMEN:  Source of Specimen:  right tubal and ovary   DISPOSITION OF SPECIMEN:  PATHOLOGY  COUNTS:  YES  TOURNIQUET:  * No tourniquets in log *  DICTATION: .Other Dictation: Dictation Number verbal   PLAN OF CARE: Admit for overnight observation  PATIENT DISPOSITION:  PACU - hemodynamically stable.   Delay start of Pharmacological VTE agent (>24hrs) due to surgical blood loss or risk of bleeding: not applicable

## 2016-02-13 NOTE — Anesthesia Preprocedure Evaluation (Addendum)
Anesthesia Evaluation  Patient identified by MRN, date of birth, ID band Patient awake    Reviewed: Allergy & Precautions, H&P , NPO status , Patient's Chart, lab work & pertinent test results, reviewed documented beta blocker date and time   History of Anesthesia Complications (+) PONV and history of anesthetic complications  Airway Mallampati: I  TM Distance: >3 FB Neck ROM: full    Dental no notable dental hx. (+) Teeth Intact   Pulmonary neg shortness of breath, neg sleep apnea, pneumonia (treated 2 weeks ago), resolved, neg COPD, neg recent URI,    Pulmonary exam normal breath sounds clear to auscultation       Cardiovascular Exercise Tolerance: Good negative cardio ROS Normal cardiovascular exam Rhythm:regular Rate:Normal     Neuro/Psych  Headaches, neg Seizures negative psych ROS   GI/Hepatic negative GI ROS, Neg liver ROS,   Endo/Other  Hashimoto's thyroiditis  Renal/GU negative Renal ROS  negative genitourinary   Musculoskeletal   Abdominal   Peds  Hematology negative hematology ROS (+)   Anesthesia Other Findings Past Medical History:   PONV (postoperative nausea and vomiting)                     Migraines                                                    Seasonal allergies                                           Hashimoto's disease                                          Reproductive/Obstetrics negative OB ROS                             Anesthesia Physical Anesthesia Plan  ASA: II  Anesthesia Plan: General ETT, Rapid Sequence and Cricoid Pressure   Post-op Pain Management:    Induction:   Airway Management Planned:   Additional Equipment:   Intra-op Plan:   Post-operative Plan:   Informed Consent: I have reviewed the patients History and Physical, chart, labs and discussed the procedure including the risks, benefits and alternatives for the proposed  anesthesia with the patient or authorized representative who has indicated his/her understanding and acceptance.   Dental Advisory Given  Plan Discussed with: Anesthesiologist, CRNA and Surgeon  Anesthesia Plan Comments:        Anesthesia Quick Evaluation

## 2016-02-13 NOTE — Progress Notes (Signed)
Patient ID: [name], female   DOB: 05/18/1981, 35 y.o.   MRN: [id number] Call back shortly after leaving for severe hypotension with sitting on toilet ( 60/30). Dr Elesa MassedWard was called in and quickly assessed. Initially thought to have increase fluid ( presumed blood) in the abdomen by bedside u/s . Once pt was back in supine position in bed BP back to 95 / 51. U/s showed a small amt of free fluid . mentation good . On 3 liters O2 and IVF bolus. Stat 2 units non- crossed , type specific blood ordered . Foley to be placed . Abdomen is non distended , soft  With appropriate incisional tenderness Will push 2 units of blood over 1 hr each . Premedicate with tylenol and benadryl . If pts blood pressure does not stabilize will take the pt back to the OR for possible bleeding from operative site.

## 2016-02-13 NOTE — Anesthesia Procedure Notes (Signed)
Procedure Name: Intubation Date/Time: 02/13/2016 1:36 PM Performed by: Malva CoganBEANE, Frida Wahlstrom Pre-anesthesia Checklist: Patient identified, Emergency Drugs available, Suction available, Patient being monitored and Timeout performed Patient Re-evaluated:Patient Re-evaluated prior to inductionOxygen Delivery Method: Circle system utilized Intubation Type: IV induction, Rapid sequence and Cricoid Pressure applied Ventilation: Mask ventilation without difficulty Laryngoscope Size: Miller and 3 Grade View: Grade II Tube type: Oral Tube size: 7.0 mm Number of attempts: 1 Airway Equipment and Method: Stylet Placement Confirmation: ETT inserted through vocal cords under direct vision,  positive ETCO2,  CO2 detector and breath sounds checked- equal and bilateral Secured at: 22 cm Tube secured with: Tape Dental Injury: Teeth and Oropharynx as per pre-operative assessment

## 2016-02-13 NOTE — Progress Notes (Signed)
Patient ID: [name], female   DOB: 01/23/1981, 35 y.o.   MRN: [id number] Foley placed and output 350 cc. >50 cc/ hr since surgery.

## 2016-02-13 NOTE — ED Notes (Signed)
Pt arrived via EMS from home for complaints of abdominal pain. Pt states was seen four days ago in ED for same problem but pain is more severe today. Pt states is [redacted] weeks pregnant. Pt reports generalized abdominal pain radiating into her rectum. EMS reports no vaginal bleeding or uterine contractions. EMS vital signs stable, CBG 116.

## 2016-02-13 NOTE — Transfer of Care (Signed)
Immediate Anesthesia Transfer of Care Note  Patient: Shannon HeadMegan Spraw Doyle  Procedure(s) Performed: Procedure(s): LAPAROSCOPY DIAGNOSTIC (N/A) LAPAROSCOPIC right salpingooophorectomy (Right)  Patient Location: PACU  Anesthesia Type:General  Level of Consciousness: awake and patient cooperative  Airway & Oxygen Therapy: Patient Spontanous Breathing and Patient connected to face mask oxygen  Post-op Assessment: Report given to RN and Post -op Vital signs reviewed and stable  Post vital signs: Reviewed and stable  Last Vitals:  Filed Vitals:   02/13/16 1220 02/13/16 1258  BP: 94/60 85/60  Pulse: 100 107  Temp:  36.2 C  Resp: 16 20    Complications: No apparent anesthesia complications

## 2016-02-14 LAB — TYPE AND SCREEN
ABO/RH(D): O POS
Antibody Screen: NEGATIVE
UNIT DIVISION: 0
Unit division: 0

## 2016-02-14 LAB — CBC
HEMATOCRIT: 30.7 % — AB (ref 35.0–47.0)
HEMATOCRIT: 31.2 % — AB (ref 35.0–47.0)
HEMOGLOBIN: 10.8 g/dL — AB (ref 12.0–16.0)
HEMOGLOBIN: 10.9 g/dL — AB (ref 12.0–16.0)
MCH: 30.6 pg (ref 26.0–34.0)
MCH: 31.1 pg (ref 26.0–34.0)
MCHC: 34.9 g/dL (ref 32.0–36.0)
MCHC: 35.3 g/dL (ref 32.0–36.0)
MCV: 88.1 fL (ref 80.0–100.0)
MCV: 87.7 fL (ref 80.0–100.0)
Platelets: 172 10*3/uL (ref 150–440)
Platelets: 177 10*3/uL (ref 150–440)
RBC: 3.48 MIL/uL — AB (ref 3.80–5.20)
RBC: 3.56 MIL/uL — AB (ref 3.80–5.20)
RDW: 12.3 % (ref 11.5–14.5)
RDW: 12.5 % (ref 11.5–14.5)
WBC: 9.3 10*3/uL (ref 3.6–11.0)
WBC: 8 10*3/uL (ref 3.6–11.0)

## 2016-02-14 LAB — SURGICAL PATHOLOGY

## 2016-02-14 LAB — HCG, QUANTITATIVE, PREGNANCY: HCG, BETA CHAIN, QUANT, S: 3249 m[IU]/mL — AB (ref <5)

## 2016-02-14 MED ORDER — FERROUS SULFATE 325 (65 FE) MG PO TABS
325.0000 mg | ORAL_TABLET | Freq: Three times a day (TID) | ORAL | Status: DC
Start: 2016-02-14 — End: 2016-02-14
  Administered 2016-02-14 (×3): 325 mg via ORAL
  Filled 2016-02-14 (×4): qty 1

## 2016-02-14 MED ORDER — BUTALBITAL-APAP-CAFFEINE 50-325-40 MG PO TABS
1.0000 | ORAL_TABLET | Freq: Four times a day (QID) | ORAL | Status: DC | PRN
  Administered 2016-02-14: 1 via ORAL
  Filled 2016-02-14: qty 1

## 2016-02-14 MED ORDER — HYDROCODONE-ACETAMINOPHEN 5-325 MG PO TABS: 1.0000 | ORAL_TABLET | Freq: Four times a day (QID) | ORAL | Status: DC | PRN

## 2016-02-14 MED ORDER — SIMETHICONE 80 MG PO CHEW
80.0000 mg | CHEWABLE_TABLET | ORAL | Status: DC | PRN
  Administered 2016-02-14: 80 mg via ORAL
  Filled 2016-02-14: qty 1

## 2016-02-14 NOTE — Progress Notes (Signed)
1 Day Post-Op Procedure(s) (LRB): LAPAROSCOPY DIAGNOSTIC (N/A) LAPAROSCOPIC right salpingooophorectomy (Right) symptomatic hypotension s/p 2 unit blood transfusion  Subjective: Patient reports .  Feels ok . No dizziness.   Objective: 94/56 p=90  Lungs CTA  CV RRR Abd: soft , non distended . + incisional TTP . Nl BS Incisions C/D /I   urine output > 100 cc/ hr   Labs: post transfusion HCT 30.7, this am = 31.2 Bhcg = 3249 down from 7074 yesterday   Assessment: s/p Procedure(s): LAPAROSCOPY DIAGNOSTIC (N/A) LAPAROSCOPIC right salpingooophorectomy (Right):  Symptomatic hypotension last pm , now s/p 2 unit transfusion and now hemodynamically stable  No signs of active intraabdominal bleeding Falling Quant c/w ovarian ectopic Plan: D/c foley  Regular diet  D/c today once she can ambulate without synmptoms Cont Prometrium vag until path results Add ferrous sulfate      [name] 02/14/2016, 6:32 AM

## 2016-02-14 NOTE — Progress Notes (Signed)
Dr. Feliberto GottronSchermerhorn called to check on pt; he also reminded RN to put in order for a CBC to be drawn 1 hour after 2nd unit was finished infusing; please call MD if HCT is < 25

## 2016-02-14 NOTE — Discharge Instructions (Signed)
Laparoscopic  Surgery, Care After Refer to this sheet in the next few weeks. These instructions provide you with information on caring for yourself after your procedure. Your caregiver may also give you more specific instructions. Your treatment has been planned according to current medical practices, but problems sometimes occur. Call your caregiver if you have any problems or questions after your procedure. HOME CARE INSTRUCTIONS  Take any medicine as directed by your caregiver. Follow the directions carefully.  Check your incisions every day.  Keep the incision area(s) dry. Ask your caregiver when it is safe to shower or bathe again.  Rest as much as possible for the next 3 days. Ask your caregiver when it is safe to go back to your normal activities.  Drink enough fluids to keep your urine clear or pale yellow.  Keep all follow-up appointments. Your caregiver will make sure you are healing the way you should be. SEEK MEDICAL CARE IF:   You have bleeding or discharge from your vagina.  You have pain in your abdomen.  You feel nauseous. SEEK IMMEDIATE MEDICAL CARE IF:   Your incision(s) becomes red, swollen, or tender.  Your incision(s) start(s) bleeding.  You have pus coming from any incision.  You have heavy or persistent vaginal bleeding or discharge.  You have severe or increased abdominal pain.  You cannot stop vomiting.  Your nausea will not go away.  You have a fever. MAKE SURE YOU:  Understand these instructions.  Watch your condition.  Get help right away if you are not doing well or get worse.   This information is not intended to replace advice given to you by your health care provider. Make sure you discuss any questions you have with your health care provider.   Document Released: 10/08/2011 Document Revised: 11/09/2014 Document Reviewed: 10/08/2011 Elsevier Interactive Patient Education [employer]2016 [employer] Inc.

## 2016-02-14 NOTE — Progress Notes (Signed)
At this time, RN and NT in room to assist pt up to bedside commode to void; while on BSC, pt begins to feel lightheaded and appears pale and diaphoretic; pt says, "i think i'm going to pass out"; O2 2L via [place] applied and staff assist button pushed; immediate assistance from several nurses; RN used ammonia and this helped some (pt did get more responsive and say "please stop that, it burns my nose"; cold cloths applied to pt's chest and back of neck; O2 increased to 3L via [place]; pupils dilated; pt still oriented to her name, the day of the week, her husband's name and her son's name; very lethargic and saying "i feel sleepy"; no vaginal bleeding seen and no bleeding from any incision seen; NT got pt back in bed; pt still pale and diaphoretic; Dr. Feliberto GottronSchermerhorn called; Dr. Elesa MassedWard (an MD from [facility] came to assist and performed ultrasound on pt's abdomen looking for bleeding; Dr. Elesa MassedWard on the phone with Dr. Feliberto GottronSchermerhorn; Dr. Feliberto GottronSchermerhorn on his way; IV fluid rate increased to 999 with NS; 2 units O+ blood ordered by Dr. Feliberto GottronSchermerhorn via phone; a nurse started putting those orders in; insert foley catheter was ordered by Dr. Feliberto GottronSchermerhorn when he arrived to pt room; pre-blood tylenol and benadryl ordered also; pt beginning to feel better now she is back in bed and laying flat; pt's husband was also called by another RN and updated, he is also coming back to hospital; 2 units pRBCs ordered; 1st unit started at 2041 and 2nd unit started at 2215 on 02-13-16 (each unit to infuse over 1 hour); foley catheter placed at 2100 on 02-13-16; nursing supervisor notified and AD called to check on this situation

## 2016-02-14 NOTE — Op Note (Signed)
NAME:  [name], [name]           ACCOUNT NO.:  [id number]  MEDICAL RECORD NO.:  [id number]  LOCATION:  347A                         FACILITY:  [facility]  PHYSICIAN:  Jennell Corner, MDDATE OF BIRTH:  1980/11/28  DATE OF PROCEDURE: DATE OF DISCHARGE:                              OPERATIVE REPORT   PREOPERATIVE DIAGNOSIS:  Right tubal ectopic pregnancy.  POSTOPERATIVE DIAGNOSIS: 1. Right ovarian ectopic pregnancy. 2. Hemoperitoneum. 3. Active bleeding.  PROCEDURE PERFORMED:  Laparoscopic right salpingo-oophorectomy.  SURGEON:  Jennell Corner, MD  ANESTHESIA:  General endotracheal anesthesia.  SURGEON:  Jennell Corner, MD  FIRST ASSISTANTJerene Canny, PA student  INDICATION:  A 35 year old, gravida 2, para 1, patient is 6 weeks estimated gestational age, admitted through the emergency room with acute abdominal pain and hypotension.  Ultrasound showed an empty uterus and a right adnexal mass Juxtaposed to the ovary.  Quantitative hCG levels rose from 2294 on February 09, 2016, to 7074 on April 13. 2017. Blood type O positive.  DESCRIPTION OF PROCEDURE:  After adequate general endotracheal anesthesia, patient was placed in dorsal supine position.  Abdomen, perineum, and vagina were prepped and draped in normal sterile fashion. A time-out was performed.  A sponge stick was placed in the vagina and used for uterine manipulation during the procedure.  A 15 mm infraumbilical incision was made after injecting with 0.5% Marcaine. The laparoscope was advanced into the abdominal cavity under direct visualization with the Optiview cannula.  Once gaining access to the abdominal cavity, the patient's abdomen was insufflated with carbon dioxide.  A 2nd port site was placed in left lower quadrant, 3 cm medial to the left anterior iliac spine and an 11 mm port was advanced under direct visualization.  Additional pressure revealed large amount of clotted and non-clotted blood in the  abdomen.  Third port site was placed in right lower quadrant, 3 cm medial to the right anterior iliac spine.  A 5 mm port was advanced under direct visualization.  The patient's abdomen was suctioned and approximately 600 mL of blood was removed.  Evaluation of the right adnexa revealed a presumed ectopic pregnancy, approximately 2 cm on the medial aspect of the ovary with active bleeding.  This was abutting area coming off the ovary that was most consistent with ectopic versus a hemorrhagic corpus luteal cyst. Given the amount of brisk bleeding and the high potential, that this was an ectopic pregnancy, surgeon opted to remove the right tube and ovary. The infundibulopelvic ligament was cauterized and dissected and transected with the Kleppingers and Harmonic Scalpel.  This was done after visualizing the right ureter course.  Sequential bites through the mesosalpinx were performed and ultimately the cornua was clamped and transected with the Harmonic Scalpel.  Good hemostasis was noted on the residual dissection site.  Ureter again appeared normal with normal peristaltic activity.  The left fallopian tube and ovary appeared normal.  The patient's abdomen was copiously irrigated and suctioned. Upper abdomen appeared normal.  The right fallopian tube and ovary was removed through an EndoCatch bag through the left lower port site. Pressure was lowered to 7 mmHg and good hemostasis was noted.  The gas was then turned off and left lower  port site was closed with the cone apparatus and the fascia was reapproximated with good approximation of edges and all instruments were then removed and the infraumbilical fascia was closed with a figure-of-eight 2-0 Vicryl suture and all the incisions were closed.  Skin incisions were closed with 4-0 Vicryl interrupted sutures, placed 2 x 2 dressing, and Tegaderm placed.  Sponge stick was removed.  The patient tolerated the procedure well.  Her  blood pressure intermittently was low.  See Anesthesia's op note, but blood pressure was normal on departing the operating room.  Estimated blood loss was 600 mL, hemoperitoneum, and additional 50 mL of bleeding during the procedure.  The patient's bladder was catheterized, at the beginning of the case yielded 100 mL clear urine and intraoperative fluids, 500 mL.  The patient tolerated the procedure well, was taken to recovery room in good condition.          ______________________________ Jennell Cornerhomas Schermerhorn, MD     TS/MEDQ  D:  02/13/2016  T:  02/14/2016  Job:  [id number]

## 2016-02-14 NOTE — H&P (Signed)
NAME:  [name], [name]           ACCOUNT NO.:  [id number]  MEDICAL RECORD NO.:  [id number]  LOCATION:  347A                         FACILITY:  [facility]  PHYSICIAN:  Jennell Cornerhomas Haydee Jabbour, MDDATE OF BIRTH:  [birth year]  DATE OF ADMISSION:  02/13/2016 DATE OF DISCHARGE:                            HISTORY AND PHYSICAL   HISTORY OF PRESENT ILLNESS:  This is a 35 year old, gravida 2, para 1 patient approximately 6 weeks estimated gestational age.  Was seen in the emergency room 4 days prior with abdominal pain.  The patient had a beta-hCG of 2200 at that point, and was discharged home thinking this was an early intrauterine pregnancy.  The patient returns today with acute onset of abdominal pain.  Ultrasound reveals a 2 cm cystic structure in the right adnexa.  Suspected hematoma around this ectopic size suggestive of ectopic pregnancy.  The patient has required narcotics in the emergency department for continued pain, hematocrit today is 40.  PAST MEDICAL HISTORY:  Hashimoto's thyroiditis.  PAST SURGICAL HISTORY:  Bilateral knee surgery.  PAST OB HISTORY:  One spontaneous vaginal delivery.  SOCIAL HISTORY:  Does not smoke.  REVIEW OF SYSTEMS:  Significant for gynecologic 1 vaginal delivery and possible ectopic pregnancy.  ALLERGIES:  FENTANYL AND OXYCODONE.  THE PATIENT HAS TAKEN HYDROCODONE IN THE PAST WITHOUT DIFFICULTY.  PHYSICAL EXAMINATION:  GENERAL:  A well-developed and well-nourished pale-appearing female in supine position. VITAL SIGNS:  Blood pressure 100/58 with a pulse of 93, 100% O2 sat. LUNGS:  Clear to auscultation. CARDIOVASCULAR:  Regular rate and rhythm. ABDOMEN:  Positive tenderness.  Nondistended. PELVIC:  Deferred.  ASSESSMENT:  A 6-week gestational ultrasound.   DICTATION ENDS HERE          ______________________________ Jennell Cornerhomas Jaccob Czaplicki, MD     TS/MEDQ  D:  02/13/2016  T:  02/14/2016  Job:  [id number]

## 2016-02-14 NOTE — Anesthesia Postprocedure Evaluation (Signed)
Anesthesia Post Note  Patient: [name]  Procedure(s) Performed: Procedure(s) (LRB): LAPAROSCOPY DIAGNOSTIC (N/A) LAPAROSCOPIC right salpingooophorectomy (Right)  Patient location during evaluation: PACU Anesthesia Type: General Level of consciousness: awake and alert Pain management: pain level controlled Vital Signs Assessment: post-procedure vital signs reviewed and stable Respiratory status: spontaneous breathing, nonlabored ventilation, respiratory function stable and patient connected to nasal cannula oxygen Cardiovascular status: blood pressure returned to baseline and stable Postop Assessment: no signs of nausea or vomiting Anesthetic complications: no    Last Vitals:  Filed Vitals:   02/14/16 0817 02/14/16 1149  BP: 98/51 103/57  Pulse: 87 83  Temp: 36.8 C 36.7 C  Resp: 18 20    Last Pain:  Filed Vitals:   02/14/16 1149  PainSc: 4                  Lenard SimmerAndrew Ondria Oswald

## 2016-02-14 NOTE — Progress Notes (Signed)
Pt c/o migraine starting.  Dr. Feliberto GottronSchermerhorn paged first.  Dr.Halfon on call for him and paged second.  Order received for home med Fioricet to be given.

## 2016-02-15 LAB — URINE CULTURE: CULTURE: NO GROWTH

## 2016-02-21 DIAGNOSIS — R51 Headache: Secondary | ICD-10-CM | POA: Diagnosis not present

## 2016-02-21 DIAGNOSIS — E860 Dehydration: Secondary | ICD-10-CM | POA: Diagnosis not present

## 2016-02-21 DIAGNOSIS — R42 Dizziness and giddiness: Secondary | ICD-10-CM | POA: Diagnosis not present

## 2016-02-21 DIAGNOSIS — R531 Weakness: Secondary | ICD-10-CM | POA: Diagnosis not present

## 2016-02-26 ENCOUNTER — Encounter: Payer: BLUE CROSS/BLUE SHIELD | Admitting: Certified Nurse Midwife

## 2016-04-30 DIAGNOSIS — Z136 Encounter for screening for cardiovascular disorders: Secondary | ICD-10-CM | POA: Diagnosis not present

## 2016-04-30 DIAGNOSIS — Z131 Encounter for screening for diabetes mellitus: Secondary | ICD-10-CM | POA: Diagnosis not present

## 2016-04-30 DIAGNOSIS — Z13220 Encounter for screening for lipoid disorders: Secondary | ICD-10-CM | POA: Diagnosis not present

## 2016-04-30 DIAGNOSIS — Z713 Dietary counseling and surveillance: Secondary | ICD-10-CM | POA: Diagnosis not present

## 2016-06-01 DIAGNOSIS — Z3200 Encounter for pregnancy test, result unknown: Secondary | ICD-10-CM | POA: Diagnosis not present

## 2016-06-01 DIAGNOSIS — O09521 Supervision of elderly multigravida, first trimester: Secondary | ICD-10-CM | POA: Diagnosis not present

## 2016-06-01 DIAGNOSIS — Z3480 Encounter for supervision of other normal pregnancy, unspecified trimester: Secondary | ICD-10-CM | POA: Diagnosis not present

## 2016-06-18 ENCOUNTER — Encounter: Payer: BLUE CROSS/BLUE SHIELD | Admitting: Obstetrics & Gynecology

## 2016-07-07 DIAGNOSIS — O0911 Supervision of pregnancy with history of ectopic or molar pregnancy, first trimester: Secondary | ICD-10-CM | POA: Diagnosis not present

## 2016-07-07 DIAGNOSIS — Z3A10 10 weeks gestation of pregnancy: Secondary | ICD-10-CM | POA: Diagnosis not present

## 2016-07-07 DIAGNOSIS — Z3481 Encounter for supervision of other normal pregnancy, first trimester: Secondary | ICD-10-CM | POA: Diagnosis not present

## 2016-07-07 DIAGNOSIS — Z113 Encounter for screening for infections with a predominantly sexual mode of transmission: Secondary | ICD-10-CM | POA: Diagnosis not present

## 2016-07-07 LAB — OB RESULTS CONSOLE RPR: RPR: NONREACTIVE

## 2016-07-07 LAB — OB RESULTS CONSOLE RUBELLA ANTIBODY, IGM: Rubella: IMMUNE

## 2016-07-07 LAB — OB RESULTS CONSOLE HIV ANTIBODY (ROUTINE TESTING): HIV: NONREACTIVE

## 2016-07-07 LAB — OB RESULTS CONSOLE GC/CHLAMYDIA
CHLAMYDIA, DNA PROBE: NEGATIVE
Gonorrhea: NEGATIVE

## 2016-07-07 LAB — OB RESULTS CONSOLE HEPATITIS B SURFACE ANTIGEN: HEP B S AG: NEGATIVE

## 2016-07-20 DIAGNOSIS — Z36 Encounter for antenatal screening of mother: Secondary | ICD-10-CM | POA: Diagnosis not present

## 2016-08-18 DIAGNOSIS — Z361 Encounter for antenatal screening for raised alphafetoprotein level: Secondary | ICD-10-CM | POA: Diagnosis not present

## 2016-08-22 DIAGNOSIS — W57XXXA Bitten or stung by nonvenomous insect and other nonvenomous arthropods, initial encounter: Secondary | ICD-10-CM | POA: Diagnosis not present

## 2016-08-22 DIAGNOSIS — S80862A Insect bite (nonvenomous), left lower leg, initial encounter: Secondary | ICD-10-CM | POA: Diagnosis not present

## 2016-09-02 DIAGNOSIS — E669 Obesity, unspecified: Secondary | ICD-10-CM | POA: Diagnosis not present

## 2016-09-02 DIAGNOSIS — Z8639 Personal history of other endocrine, nutritional and metabolic disease: Secondary | ICD-10-CM | POA: Diagnosis not present

## 2016-09-02 DIAGNOSIS — Z3A18 18 weeks gestation of pregnancy: Secondary | ICD-10-CM | POA: Diagnosis not present

## 2016-09-02 DIAGNOSIS — Z6839 Body mass index (BMI) 39.0-39.9, adult: Secondary | ICD-10-CM | POA: Diagnosis not present

## 2016-09-02 DIAGNOSIS — O09522 Supervision of elderly multigravida, second trimester: Secondary | ICD-10-CM | POA: Diagnosis not present

## 2016-11-02 NOTE — L&D Delivery Note (Addendum)
Delivery Note At 3:19 PM a viable female was delivered via Vaginal, Spontaneous Delivery (Presentation: OA to ROT, compound R hand ).  APGAR: 9, 9; weight pending .   Placenta status: S/C/I, trailing membranes, .  Cord:  with the following complications: none. 3VC  Anesthesia:  local Episiotomy: None Lacerations: 2nd degree vaginal, perineum dermis splayed from bulbocavernosus muscle Suture Repair: 3.0 vicryl, couple of interuped sutures and locked stitch on vaginal floor, good hemostasis and approximation.  Est. Blood Loss (mL):  200  Mom to postpartum.  Baby to Couplet care / Skin to Skin.  Neta Mends 02/02/2017, 3:58 PM

## 2016-11-20 DIAGNOSIS — Z23 Encounter for immunization: Secondary | ICD-10-CM | POA: Diagnosis not present

## 2016-11-20 DIAGNOSIS — Z3689 Encounter for other specified antenatal screening: Secondary | ICD-10-CM | POA: Diagnosis not present

## 2016-12-08 DIAGNOSIS — Z8639 Personal history of other endocrine, nutritional and metabolic disease: Secondary | ICD-10-CM | POA: Diagnosis not present

## 2016-12-22 DIAGNOSIS — R05 Cough: Secondary | ICD-10-CM | POA: Diagnosis not present

## 2016-12-22 DIAGNOSIS — J029 Acute pharyngitis, unspecified: Secondary | ICD-10-CM | POA: Diagnosis not present

## 2016-12-22 DIAGNOSIS — J019 Acute sinusitis, unspecified: Secondary | ICD-10-CM | POA: Diagnosis not present

## 2017-01-01 DIAGNOSIS — Z3A36 36 weeks gestation of pregnancy: Secondary | ICD-10-CM | POA: Diagnosis not present

## 2017-01-01 DIAGNOSIS — Z3685 Encounter for antenatal screening for Streptococcus B: Secondary | ICD-10-CM | POA: Diagnosis not present

## 2017-01-01 DIAGNOSIS — O09523 Supervision of elderly multigravida, third trimester: Secondary | ICD-10-CM | POA: Diagnosis not present

## 2017-01-07 DIAGNOSIS — Z3685 Encounter for antenatal screening for Streptococcus B: Secondary | ICD-10-CM | POA: Diagnosis not present

## 2017-01-07 LAB — OB RESULTS CONSOLE GBS: STREP GROUP B AG: NEGATIVE

## 2017-01-22 DIAGNOSIS — Z3A39 39 weeks gestation of pregnancy: Secondary | ICD-10-CM | POA: Diagnosis not present

## 2017-01-22 DIAGNOSIS — O09523 Supervision of elderly multigravida, third trimester: Secondary | ICD-10-CM | POA: Diagnosis not present

## 2017-01-27 DIAGNOSIS — O09523 Supervision of elderly multigravida, third trimester: Secondary | ICD-10-CM | POA: Diagnosis not present

## 2017-01-27 DIAGNOSIS — Z3A39 39 weeks gestation of pregnancy: Secondary | ICD-10-CM | POA: Diagnosis not present

## 2017-02-02 ENCOUNTER — Encounter (HOSPITAL_COMMUNITY): Payer: Self-pay | Admitting: *Deleted

## 2017-02-02 ENCOUNTER — Inpatient Hospital Stay (HOSPITAL_COMMUNITY)
Admission: AD | Admit: 2017-02-02 | Discharge: 2017-02-03 | DRG: 775 | Disposition: A | Discharge status: Home / Self Care | Payer: BLUE CROSS/BLUE SHIELD | Source: Ambulatory Visit | Attending: Obstetrics & Gynecology | Admitting: Obstetrics & Gynecology

## 2017-02-02 DIAGNOSIS — O326XX0 Maternal care for compound presentation, not applicable or unspecified: Secondary | ICD-10-CM | POA: Diagnosis present

## 2017-02-02 DIAGNOSIS — Z3490 Encounter for supervision of normal pregnancy, unspecified, unspecified trimester: Secondary | ICD-10-CM

## 2017-02-02 DIAGNOSIS — Z8249 Family history of ischemic heart disease and other diseases of the circulatory system: Secondary | ICD-10-CM | POA: Diagnosis not present

## 2017-02-02 DIAGNOSIS — Z3493 Encounter for supervision of normal pregnancy, unspecified, third trimester: Secondary | ICD-10-CM | POA: Diagnosis not present

## 2017-02-02 DIAGNOSIS — Z833 Family history of diabetes mellitus: Secondary | ICD-10-CM | POA: Diagnosis not present

## 2017-02-02 DIAGNOSIS — E063 Autoimmune thyroiditis: Secondary | ICD-10-CM | POA: Diagnosis present

## 2017-02-02 DIAGNOSIS — Z3A40 40 weeks gestation of pregnancy: Secondary | ICD-10-CM | POA: Diagnosis not present

## 2017-02-02 DIAGNOSIS — G43909 Migraine, unspecified, not intractable, without status migrainosus: Secondary | ICD-10-CM | POA: Diagnosis present

## 2017-02-02 DIAGNOSIS — Z8349 Family history of other endocrine, nutritional and metabolic diseases: Secondary | ICD-10-CM | POA: Diagnosis not present

## 2017-02-02 DIAGNOSIS — O99284 Endocrine, nutritional and metabolic diseases complicating childbirth: Secondary | ICD-10-CM | POA: Diagnosis present

## 2017-02-02 DIAGNOSIS — Z2882 Immunization not carried out because of caregiver refusal: Secondary | ICD-10-CM | POA: Diagnosis not present

## 2017-02-02 MED ORDER — SIMETHICONE 80 MG PO CHEW: 80.0000 mg | CHEWABLE_TABLET | ORAL | Status: DC | PRN

## 2017-02-02 MED ORDER — COCONUT OIL OIL: 1.0000 "application " | TOPICAL_OIL | Status: DC | PRN

## 2017-02-02 MED ORDER — SOD CITRATE-CITRIC ACID 500-334 MG/5ML PO SOLN: 30.0000 mL | ORAL | Status: DC | PRN

## 2017-02-02 MED ORDER — ONDANSETRON HCL 4 MG PO TABS: 4.0000 mg | ORAL_TABLET | ORAL | Status: DC | PRN

## 2017-02-02 MED ORDER — LIDOCAINE HCL (PF) 1 % IJ SOLN
30.0000 mL | INTRAMUSCULAR | Status: DC | PRN
  Administered 2017-02-02: 30 mL via SUBCUTANEOUS
  Filled 2017-02-02: qty 30

## 2017-02-02 MED ORDER — OXYTOCIN 40 UNITS IN LACTATED RINGERS INFUSION - SIMPLE MED: 2.5000 [IU]/h | INTRAVENOUS | Status: DC

## 2017-02-02 MED ORDER — BISACODYL 10 MG RE SUPP: 10.0000 mg | Freq: Every day | RECTAL | Status: DC | PRN

## 2017-02-02 MED ORDER — SENNOSIDES-DOCUSATE SODIUM 8.6-50 MG PO TABS
2.0000 | ORAL_TABLET | ORAL | Status: DC
  Administered 2017-02-02: 2 via ORAL
  Filled 2017-02-02: qty 2

## 2017-02-02 MED ORDER — LACTATED RINGERS IV SOLN: INTRAVENOUS | Status: DC

## 2017-02-02 MED ORDER — ONDANSETRON HCL 4 MG/2ML IJ SOLN: 4.0000 mg | INTRAMUSCULAR | Status: DC | PRN

## 2017-02-02 MED ORDER — OXYTOCIN 10 UNIT/ML IJ SOLN
INTRAMUSCULAR | Status: AC
  Filled 2017-02-02: qty 1

## 2017-02-02 MED ORDER — LACTATED RINGERS IV SOLN: 500.0000 mL | INTRAVENOUS | Status: DC | PRN

## 2017-02-02 MED ORDER — BENZOCAINE-MENTHOL 20-0.5 % EX AERO
1.0000 | INHALATION_SPRAY | CUTANEOUS | Status: DC | PRN
Start: 2017-02-02 — End: 2017-02-03
  Administered 2017-02-02: 1 via TOPICAL
  Filled 2017-02-02: qty 56

## 2017-02-02 MED ORDER — DIBUCAINE 1 % RE OINT: 1.0000 "application " | TOPICAL_OINTMENT | RECTAL | Status: DC | PRN

## 2017-02-02 MED ORDER — ACETAMINOPHEN 325 MG PO TABS
650.0000 mg | ORAL_TABLET | ORAL | Status: DC | PRN
  Administered 2017-02-02 (×2): 650 mg via ORAL
  Filled 2017-02-02 (×2): qty 2

## 2017-02-02 MED ORDER — IBUPROFEN 600 MG PO TABS
600.0000 mg | ORAL_TABLET | Freq: Four times a day (QID) | ORAL | Status: DC
  Administered 2017-02-02 – 2017-02-03 (×4): 600 mg via ORAL
  Filled 2017-02-02 (×4): qty 1

## 2017-02-02 MED ORDER — OXYTOCIN 10 UNIT/ML IJ SOLN
10.0000 [IU] | Freq: Once | INTRAMUSCULAR | Status: AC
  Administered 2017-02-02: 10 [IU] via INTRAMUSCULAR

## 2017-02-02 MED ORDER — FENUGREEK 610 MG PO CAPS: 3.0000 | ORAL_CAPSULE | Freq: Three times a day (TID) | ORAL | Status: DC

## 2017-02-02 MED ORDER — PRENATAL MULTIVITAMIN CH
1.0000 | ORAL_TABLET | Freq: Every day | ORAL | Status: DC
  Administered 2017-02-03: 1 via ORAL
  Filled 2017-02-02: qty 1

## 2017-02-02 MED ORDER — FLEET ENEMA 7-19 GM/118ML RE ENEM: 1.0000 | ENEMA | Freq: Every day | RECTAL | Status: DC | PRN

## 2017-02-02 MED ORDER — WITCH HAZEL-GLYCERIN EX PADS
1.0000 "application " | MEDICATED_PAD | CUTANEOUS | Status: DC | PRN
  Administered 2017-02-02: 1 via TOPICAL

## 2017-02-02 MED ORDER — ONDANSETRON HCL 4 MG/2ML IJ SOLN: 4.0000 mg | Freq: Four times a day (QID) | INTRAMUSCULAR | Status: DC | PRN

## 2017-02-02 MED ORDER — ACETAMINOPHEN 325 MG PO TABS: 650.0000 mg | ORAL_TABLET | ORAL | Status: DC | PRN

## 2017-02-02 MED ORDER — TETANUS-DIPHTH-ACELL PERTUSSIS 5-2.5-18.5 LF-MCG/0.5 IM SUSP: 0.5000 mL | Freq: Once | INTRAMUSCULAR | Status: DC

## 2017-02-02 MED ORDER — OXYTOCIN BOLUS FROM INFUSION: 500.0000 mL | Freq: Once | INTRAVENOUS | Status: DC

## 2017-02-02 MED ORDER — DIPHENHYDRAMINE HCL 25 MG PO CAPS: 25.0000 mg | ORAL_CAPSULE | Freq: Four times a day (QID) | ORAL | Status: DC | PRN

## 2017-02-02 NOTE — MAU Note (Signed)
Pt presents to MAU with complaints of contractions that started early this morning. Denies any LOF or VB

## 2017-02-02 NOTE — H&P (Signed)
OB ADMISSION/ HISTORY & PHYSICAL:  Admission Date: 02/02/2017  2:49 PM  Admit Diagnosis: Term pregnancy  [name] is a 36 y.o. female presenting for labor, ctx started 0400 today, intense since arrival and + urge to push. No LOF or VB, + FM.  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  Planned waterbirth, low intervention  Prenatal Labs: ABO, Rh:    Antibody: NEG (04/13 1151) Rubella: Immune (09/05 0000)  RPR: Nonreactive (09/05 0000)  HBsAg: Negative (09/05 0000)  HIV: Non-reactive (09/05 0000)  GBS: Negative (03/08 0000)  1 hr Glucola : 87 Genetic screening: Informaseq and AFP1 wnl   Medical / Surgical History :  Past medical history:  Past Medical History:  Diagnosis Date  . Hashimoto's disease   . Migraines   . PONV (postoperative nausea and vomiting)   . Seasonal allergies      Past surgical history:  Past Surgical History:  Procedure Laterality Date  . KNEE ARTHROSCOPY Right 12/2011   Microfracture  . KNEE ARTHROSCOPY Left 08/2008  . LAPAROSCOPIC UNILATERAL SALPINGECTOMY Right 02/13/2016   Procedure: LAPAROSCOPIC right salpingooophorectomy;  Surgeon: Suzy Bouchard, MD;  Location: [facility] ORS;  Service: Gynecology;  Laterality: Right;  . LAPAROSCOPY N/A 02/13/2016   Procedure: LAPAROSCOPY DIAGNOSTIC;  Surgeon: Suzy Bouchard, MD;  Location: [facility] ORS;  Service: Gynecology;  Laterality: N/A;  . TONSILLECTOMY AND ADENOIDECTOMY       Family History:  Family History  Problem Relation Age of Onset  . Cancer Mother     stomach  . Hypertension Father   . Cancer Maternal Grandfather     brain  . Cancer Paternal Grandmother     breast  . Heart disease Paternal Grandfather   . Diabetes Mellitus I Sister   . Thyroid disease Neg  Hx      Social History:  reports that she has never smoked. She has never used smokeless tobacco. She reports that she does not drink alcohol or use drugs.   Allergies: Cephalosporins; Fentanyl; Gluten meal; Oxycodone; and Penicillins    Current Medications at time of admission:  Prescriptions Prior to Admission  Medication Sig Dispense Refill Last Dose  . Butalbital-APAP-Caffeine 50-325-40 MG per capsule Take 1-2 capsules by mouth every 6 (six) hours as needed for headache. 30 capsule 3 Taking  . Fenugreek 610 MG CAPS Take 3 tablets by mouth 3 (three) times daily. Reported on 11/06/2015   Not Taking  . HYDROcodone-acetaminophen (NORCO) 5-325 MG tablet Take 1-2 tablets by mouth every 6 (six) hours as needed for moderate pain. 30 tablet 0       Review of Systems: ROS  As noted above   Physical Exam:  Dilation: 10 Station: -2 Exam by:: Ivery Quale RN VSSAF  General: AAO x 3, moaning and vocalizing w/ ctx Heart: RRR Lungs:CTAB Abdomen: gravid, NT. + Ctx Extremities: no edema Genitalia / VE: BBOW, vertex +1 FHR: 122 intermittent TOCO: tcx palp q 2 min  Labs:    No results for input(s): WBC, HGB, HCT, PLT in the last 72 hours.   Assessment:  36 y.o. G2P1001 at [redacted]w[redacted]d  1. Labor: impending delivery 2. Fetal Wellbeing: Category 1  3. Pain Control: labor support 4. GBS: neg   Plan:  1. Admit to BS 2. Routine L&D orders 3.  Prepare for dlivery    Consultant: Dr. Resa Miner, CNM, MSN 02/02/2017, 3:58 PM

## 2017-02-03 LAB — CBC
HEMATOCRIT: 33.7 % — AB (ref 36.0–46.0)
HEMOGLOBIN: 12 g/dL (ref 12.0–15.0)
MCH: 30.4 pg (ref 26.0–34.0)
MCHC: 35.6 g/dL (ref 30.0–36.0)
MCV: 85.3 fL (ref 78.0–100.0)
Platelets: 183 10*3/uL (ref 150–400)
RBC: 3.95 MIL/uL (ref 3.87–5.11)
RDW: 13.5 % (ref 11.5–15.5)
WBC: 11.7 10*3/uL — AB (ref 4.0–10.5)

## 2017-02-03 MED ORDER — BUTALBITAL-APAP-CAFFEINE 50-325-40 MG PO TABS: 1.0000 | ORAL_TABLET | ORAL | 0 refills | Status: DC | PRN

## 2017-02-03 MED ORDER — ACETAMINOPHEN 325 MG PO TABS: 650.0000 mg | ORAL_TABLET | ORAL | 0 refills | Status: AC | PRN

## 2017-02-03 MED ORDER — BENZOCAINE-MENTHOL 20-0.5 % EX AERO: 1.0000 "application " | INHALATION_SPRAY | CUTANEOUS | 1 refills | Status: AC | PRN

## 2017-02-03 MED ORDER — BUTALBITAL-APAP-CAFFEINE 50-325-40 MG PO TABS
1.0000 | ORAL_TABLET | ORAL | Status: DC | PRN
  Administered 2017-02-03 (×3): 1 via ORAL
  Filled 2017-02-03 (×3): qty 1

## 2017-02-03 MED ORDER — ACETAMINOPHEN 500 MG PO TABS
500.0000 mg | ORAL_TABLET | Freq: Once | ORAL | Status: AC
  Administered 2017-02-03: 500 mg via ORAL
  Filled 2017-02-03: qty 1

## 2017-02-03 MED ORDER — IBUPROFEN 600 MG PO TABS: 600.0000 mg | ORAL_TABLET | Freq: Four times a day (QID) | ORAL | 0 refills | Status: DC

## 2017-02-03 NOTE — Discharge Summary (Signed)
Obstetric Discharge Summary Reason for Admission: onset of labor Prenatal Procedures: ultrasound Intrapartum Procedures: spontaneous vaginal delivery Postpartum Procedures: none Complications-Operative and Postpartum: 2nd degree perineal laceration Hemoglobin  Date Value Ref Range Status  02/03/2017 12.0 12.0 - 15.0 g/dL Final  40/98/1191 47.8 g/dL Final   HCT  Date Value Ref Range Status  02/03/2017 33.7 (L) 36.0 - 46.0 % Final  03/09/2014 38 % Final    Physical Exam:  General: alert and cooperative Lochia: appropriate Uterine Fundus: firm Incision: healing well, no significant drainage DVT Evaluation: No evidence of DVT seen on physical exam.  Discharge Diagnoses: Term Pregnancy-delivered  Discharge Information: Date: 02/03/2017 Activity: unrestricted and pelvic rest Diet: routine Medications: PNV, Ibuprofen and Tylenol and Fiorecet as needed for migraine Condition: stable Instructions: refer to practice specific booklet and hospital discharge booklet  Discharge to: home   Newborn Data: Live born female  Birth Weight: 7 lb 10.8 oz (3481 g) APGAR: 9, 9  Home with mother.    F/up with Arlan Organ CNM in 6 weeks   Juwann Sherk R 02/03/2017, 11:34 AM

## 2017-02-03 NOTE — Lactation Note (Signed)
This note was copied from a baby's chart. Lactation Consultation Note  Patient Name: [name] [id number] Date: 02/03/2017 Reason for consult: Follow-up assessment;Breast/nipple pain  Baby is 24 hours old , this  LC has seem mom twice this afternoon.  1st time the baby had eaten at 1345 for 10 mins per parents.  So LC assessed tissue due to sore nipples with moms permission.  Per mom the left is sorer than the right.  LC reviewed steps for making the soreness better - reviewed hand expressing , and mom repeated hand expressing , with several ml expressed. LC added steps - breast massage, hand express , pre- pump with  Hand pump to make the nipple areola complex more elastic for a deeper latch. #24 flange a good fit for today. Per mom has  DEBP at home and has larger flanges if needed. 2nd visit baby awake , attempted latch , and baby stooled and wet, LC helped dad change the diaper. And LC proceeded to assist mom to latch with depth on the right breast with firm support , following the about steps prior to latch. Once the baby latched , LC showed dad how to ease chin down , flip upper lip to flanged position to obtain depth .  Per mom the latch felt better. And baby fed 10 mins, with swallows , increased with breast compressions. The baby stayed properly latched with depth, sluggish at times requiring stimulation. Baby became non - nutritive and LC had mom release latch, and nipple well rounded.  LC reviewed sore nipple and engorgement prevention and tx.  Mom has a DEBP at home.  Dad wrote down LC plan in the back of the Mother / Baby booklet.  Pedis Doctor plans to order a F/U referral for lactation to evaluate milk transfer  LC O/P appt. For next Tuesday 4/10 at 10:00 am , appt. Reminder given to dad.  Mom and dad receptive to [facility] O/P appt.due to their major Breast feeding challenges with their 1st baby.   LC suspects baby may have a short labial frenulum . Even though the upper lip  stretches with exam and flipped to the flanged position with latch. High palate noted.  And intermittent humping of the back part of the tongue when sucking on the LC gloved finger.      Maternal Data Has patient been taught Hand Expression?: Yes Does the patient have breastfeeding experience prior to this delivery?: Yes  Feeding Feeding Type: Breast Fed Length of feed: 10 min (swallows noted, sluggish latch, )  LATCH Score/Interventions Latch: Grasps breast easily, tongue down, lips flanged, rhythmical sucking. Intervention(s): Assist with latch;Adjust position;Breast massage;Breast compression  Audible Swallowing: Spontaneous and intermittent Intervention(s): Skin to skin  Type of Nipple: Everted at rest and after stimulation  Comfort (Breast/Nipple): Filling, red/small blisters or bruises, mild/mod discomfort  Problem noted: Mild/Moderate discomfort;Cracked, bleeding, blisters, bruises  Hold (Positioning): Assistance needed to correctly position infant at breast and maintain latch. Intervention(s): Breastfeeding basics reviewed;Support Pillows;Position options;Skin to skin  LATCH Score: 8  Lactation Tools Discussed/Used Tools: Pump Shell Type: Inverted Breast pump type: Manual WIC Program: No Pump Review: Setup, frequency, and cleaning Initiated by:: MAI  Date initiated:: 02/03/17   Consult Status Consult Status: Follow-up Date: 02/09/17 (LC O/P WH at 10:00 , Pedis to write referral with [facility] ) Follow-up type: Out-patient    [name] [name] 02/03/2017, 4:13 PM

## 2017-02-03 NOTE — Progress Notes (Signed)
Post Partum Day 1, SVD soon after arrival to hospital  Subjective: Migraine, better with Fiorecet  Objective: Blood pressure 103/63, pulse 87, temperature 97.9 F (36.6 C), temperature source Oral, resp. rate 18, last menstrual period 04/23/2016, unknown if currently breastfeeding.  Physical Exam:  General: alert and cooperative Lochia: appropriate Uterine Fundus: firm Incision: healing well, no significant drainage DVT Evaluation: No evidence of DVT seen on physical exam.   Recent Labs  02/03/17 0539  HGB 12.0  HCT 33.7*    Assessment/Plan: Discharge home PP care and warning s/s reviewed  Baby GIRL. Breast feeding.   Request for early discharge later today   LOS: 1 day   Allianna Beaubien R 02/03/2017, 11:30 AM

## 2017-02-03 NOTE — Progress Notes (Signed)
Patient c/o "I feel a migraine coming on." called to Ivonne Andrew CNM for [facility] order. Phone order for Fiorcet 50-325-40 Q4-6 hrs prn.

## 2017-02-03 NOTE — Plan of Care (Signed)
Problem: Education: Goal: Knowledge of condition will improve Outcome: Completed/Met Date Met: 02/03/17 Patient complains of sore, cracked nipples. Patient has own organic cream/oil and declines coconut oil. Provided with shells for comfort between feedings and consulted with lactation to provide comfort gels upon [facility] consult.

## 2017-02-09 ENCOUNTER — Ambulatory Visit: Payer: Self-pay

## 2017-02-09 NOTE — Lactation Note (Addendum)
This note was copied from a baby's chart. Lactation Consult  Mother's reason for visit:   Mother had difficulty with latching while in the hospital. Birth weight 7-10.8 Peds office 7-1 on Friday.  weight check tomorrow with Peds  Visit Type:  Feeding assessement  Appointment Notes:   Mother reports that she was hospitallized for Mastitis and had repeat accurances of Mastitis at least 5 times.  Mother reports that breastfeeding is difficult. She describes nipple pain on initial latch as #6. She has bilateral cracking. Mothers nipples are red and cracked. She states that they are healing and look better. Mother has scaring form previous child.  Mother reports that infant bites while feeding. Mother has attempt to use a nipple shield and infant refuses. Mother is using comfort gels .  Consult:  Initial Lactation Consultant:  Michel Bickers  ________________________________________________________________________    ________________________________________________________________________  Mother's Name: [name] Type of delivery:  Vaginal, Spontaneous Delivery Breastfeeding Experience:  13 months  Maternal Medical Conditions:  Thyroid, Hasimotto Maternal Medications: Prenatal vits , fioricet for migranes Breastfeeding history: mother reports that her first child breastfed and supplemented for 13 months. She reports that infant had tongue revision at 3 months and breastfeeding became better but still needed donor milk and formula. ________________________________________________________________________  Breastfeeding History (Post Discharge)  Frequency of breastfeeding: every 2-3 hours Duration of feeding:  10-60 mins  Pumping  Type of pump:  Medela pump in style, mother has not started pumping. She has only hand expressed  Infant Intake and Output Assessment  Voids:  6 in 24 hrs.  Color:  Clear yellow Stools:  1 in 24 hrs.  Color:  Brown and  Yellow  ________________________________________________________________________  Maternal Breast Assessment  Breast:  Full Nipple:  Erect Pain level:  6 Pain interventions:  Bra  _______________________________________________________________________ Feeding Assessment/Evaluation  Initial feeding assessment: Mother waits for infant to open wide and then latches infant on in football hold. Mother describes pain scale of #4 with initial latch.  Infant clamps down hard and mother reports she feels infant bite. Infant flanges her top lip well and has lower lip turned upward. Unable to flange lower lip  downward. Infant has a tight jaw. Mothers nipples pinch with ridge on the top part of nipple, underside is flat.   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 Infants tongue slightly bowl shaped.   Pre-feed weight: 3304, 7-4.5 Post-feed weight:  3314, 7-4.9 Amount transferred: 10 ml   Additional Feeding Assessment - infant latched on with a wider gape. Infant sleepy and hard to rouse for feeding , switched positions and still sleepy. Infant transferred 6 ml from alternate breast. Mothers nipple remains pinched after infant released the breast.    Pre-feed weight:  3314,7-4.9 Post-feed weight:  3320, 7-5.1 Amount transferred: 6 ml   Infant latched on multiple times after weight check , weights were not done . Observed that infant didn't have a good pattern of suckling and limited swallows. Mother reports that infant usually has a good feeding with consistent suckling for 15-20  mins.   Total amount transferred:  16 ml  Advised mother to get Rx for [facility] for nipples Advised mother to continue to cue base feed and at least 8-12 times in 24 hours When infant sleepy rouse with switch nursing from breast to breast to wake Suggested that mother post pump for 15-20 min 1-2 times daily to protect milk supply Suggested that  mother follow up for feeding assessment in one week Advised mother to have infants tongue evaluated for post tongue tie, with Dr Jodelle Red or Dr Lexine Baton  Mother reports that she will call and schedule a follow up appt. Suggested to follow up to [facility] on Tuesday Peds appt tomorrow on April 11.

## 2017-02-24 DIAGNOSIS — Z3482 Encounter for supervision of other normal pregnancy, second trimester: Secondary | ICD-10-CM | POA: Diagnosis not present

## 2017-02-24 DIAGNOSIS — Z3483 Encounter for supervision of other normal pregnancy, third trimester: Secondary | ICD-10-CM | POA: Diagnosis not present

## 2017-07-30 ENCOUNTER — Telehealth: Payer: Self-pay | Admitting: Primary Care

## 2017-07-30 ENCOUNTER — Emergency Department: Payer: BLUE CROSS/BLUE SHIELD

## 2017-07-30 ENCOUNTER — Emergency Department
Admission: EM | Admit: 2017-07-30 | Discharge: 2017-07-30 | Disposition: A | Discharge status: Home / Self Care | Payer: BLUE CROSS/BLUE SHIELD | Attending: Emergency Medicine | Admitting: Emergency Medicine

## 2017-07-30 ENCOUNTER — Encounter: Payer: Self-pay | Admitting: Emergency Medicine

## 2017-07-30 ENCOUNTER — Encounter: Payer: Self-pay | Admitting: Primary Care

## 2017-07-30 ENCOUNTER — Ambulatory Visit (INDEPENDENT_AMBULATORY_CARE_PROVIDER_SITE_OTHER): Payer: BLUE CROSS/BLUE SHIELD | Admitting: Primary Care

## 2017-07-30 VITALS — BP 120/78 | HR 78 | Temp 98.0°F

## 2017-07-30 DIAGNOSIS — R946 Abnormal results of thyroid function studies: Secondary | ICD-10-CM | POA: Diagnosis not present

## 2017-07-30 DIAGNOSIS — R112 Nausea with vomiting, unspecified: Secondary | ICD-10-CM | POA: Diagnosis not present

## 2017-07-30 DIAGNOSIS — E063 Autoimmune thyroiditis: Secondary | ICD-10-CM | POA: Diagnosis not present

## 2017-07-30 DIAGNOSIS — K2900 Acute gastritis without bleeding: Secondary | ICD-10-CM | POA: Diagnosis not present

## 2017-07-30 DIAGNOSIS — Z79899 Other long term (current) drug therapy: Secondary | ICD-10-CM | POA: Diagnosis not present

## 2017-07-30 DIAGNOSIS — R109 Unspecified abdominal pain: Secondary | ICD-10-CM | POA: Diagnosis not present

## 2017-07-30 DIAGNOSIS — R1010 Upper abdominal pain, unspecified: Secondary | ICD-10-CM | POA: Diagnosis present

## 2017-07-30 DIAGNOSIS — R1084 Generalized abdominal pain: Secondary | ICD-10-CM

## 2017-07-30 DIAGNOSIS — K7689 Other specified diseases of liver: Secondary | ICD-10-CM | POA: Insufficient documentation

## 2017-07-30 DIAGNOSIS — N200 Calculus of kidney: Secondary | ICD-10-CM | POA: Diagnosis not present

## 2017-07-30 DIAGNOSIS — R7989 Other specified abnormal findings of blood chemistry: Secondary | ICD-10-CM

## 2017-07-30 DIAGNOSIS — R1013 Epigastric pain: Secondary | ICD-10-CM | POA: Diagnosis not present

## 2017-07-30 LAB — CBC WITH DIFFERENTIAL/PLATELET
BASOS ABS: 0 10*3/uL (ref 0.0–0.1)
Basophils Relative: 0.5 % (ref 0.0–3.0)
Eosinophils Absolute: 0 10*3/uL (ref 0.0–0.7)
Eosinophils Relative: 0 % (ref 0.0–5.0)
HCT: 44.8 % (ref 36.0–46.0)
HEMOGLOBIN: 15.2 g/dL — AB (ref 12.0–15.0)
LYMPHS ABS: 1 10*3/uL (ref 0.7–4.0)
Lymphocytes Relative: 18.5 % (ref 12.0–46.0)
MCHC: 33.9 g/dL (ref 30.0–36.0)
MCV: 87.4 fl (ref 78.0–100.0)
MONO ABS: 0.3 10*3/uL (ref 0.1–1.0)
MONOS PCT: 6.2 % (ref 3.0–12.0)
NEUTROS PCT: 74.8 % (ref 43.0–77.0)
Neutro Abs: 4.2 10*3/uL (ref 1.4–7.7)
Platelets: 261 10*3/uL (ref 150.0–400.0)
RBC: 5.12 Mil/uL — AB (ref 3.87–5.11)
RDW: 12.3 % (ref 11.5–15.5)
WBC: 5.6 10*3/uL (ref 4.0–10.5)

## 2017-07-30 LAB — COMPREHENSIVE METABOLIC PANEL
ALK PHOS: 62 U/L (ref 39–117)
ALK PHOS: 54 U/L (ref 38–126)
ALT: 15 U/L (ref 0–35)
ALT: 16 U/L (ref 14–54)
AST: 12 U/L (ref 0–37)
AST: 14 U/L — AB (ref 15–41)
Albumin: 4.7 g/dL (ref 3.5–5.2)
Albumin: 4.1 g/dL (ref 3.5–5.0)
Anion gap: 10 (ref 5–15)
BUN: 16 mg/dL (ref 6–23)
BUN: 17 mg/dL (ref 6–20)
CHLORIDE: 103 mmol/L (ref 101–111)
CO2: 25 mEq/L (ref 19–32)
CO2: 22 mmol/L (ref 22–32)
CREATININE: 0.76 mg/dL (ref 0.40–1.20)
Calcium: 9.9 mg/dL (ref 8.4–10.5)
Calcium: 8.6 mg/dL — ABNORMAL LOW (ref 8.9–10.3)
Chloride: 97 mEq/L (ref 96–112)
Creatinine, Ser: 0.8 mg/dL (ref 0.44–1.00)
GFR calc Af Amer: >60 mL/min (ref >60)
GFR: 90.58 mL/min (ref >60.00)
GLUCOSE: 107 mg/dL — AB (ref 70–99)
Glucose, Bld: 101 mg/dL — ABNORMAL HIGH (ref 65–99)
Potassium: 4.6 mEq/L (ref 3.5–5.1)
Potassium: 4.2 mmol/L (ref 3.5–5.1)
Sodium: 131 mEq/L — ABNORMAL LOW (ref 135–145)
Sodium: 135 mmol/L (ref 135–145)
TOTAL PROTEIN: 7.6 g/dL (ref 6.0–8.3)
TOTAL PROTEIN: 6.9 g/dL (ref 6.5–8.1)
Total Bilirubin: 0.9 mg/dL (ref 0.2–1.2)
Total Bilirubin: 0.9 mg/dL (ref 0.3–1.2)

## 2017-07-30 LAB — URINALYSIS, COMPLETE (UACMP) WITH MICROSCOPIC
BILIRUBIN URINE: NEGATIVE
Glucose, UA: NEGATIVE mg/dL
HGB URINE DIPSTICK: NEGATIVE
KETONES UR: 20 mg/dL — AB
NITRITE: NEGATIVE
PH: 7 (ref 5.0–8.0)
Protein, ur: NEGATIVE mg/dL
SPECIFIC GRAVITY, URINE: 1.016 (ref 1.005–1.030)

## 2017-07-30 LAB — CBC
HEMATOCRIT: 40.7 % (ref 35.0–47.0)
Hemoglobin: 14.2 g/dL (ref 12.0–16.0)
MCH: 30.1 pg (ref 26.0–34.0)
MCHC: 34.8 g/dL (ref 32.0–36.0)
MCV: 86.4 fL (ref 80.0–100.0)
Platelets: 221 10*3/uL (ref 150–440)
RBC: 4.71 MIL/uL (ref 3.80–5.20)
RDW: 12.4 % (ref 11.5–14.5)
WBC: 5.8 10*3/uL (ref 3.6–11.0)

## 2017-07-30 LAB — HCG, QUANTITATIVE, PREGNANCY

## 2017-07-30 LAB — LIPASE, BLOOD: Lipase: 31 U/L (ref 11–51)

## 2017-07-30 LAB — T4, FREE: Free T4: 0.94 ng/dL (ref 0.60–1.60)

## 2017-07-30 LAB — LIPASE: Lipase: 35 U/L (ref 11.0–59.0)

## 2017-07-30 LAB — TSH: TSH: 1.05 u[IU]/mL (ref 0.35–4.50)

## 2017-07-30 MED ORDER — ONDANSETRON 4 MG PO TBDP: 4.0000 mg | ORAL_TABLET | Freq: Four times a day (QID) | ORAL | 0 refills | Status: DC | PRN

## 2017-07-30 MED ORDER — ONDANSETRON 4 MG PO TBDP
4.0000 mg | ORAL_TABLET | Freq: Once | ORAL | Status: AC
  Administered 2017-07-30: 4 mg via ORAL

## 2017-07-30 MED ORDER — IOPAMIDOL (ISOVUE-300) INJECTION 61%
100.0000 mL | Freq: Once | INTRAVENOUS | Status: AC | PRN
  Administered 2017-07-30: 100 mL via INTRAVENOUS

## 2017-07-30 MED ORDER — ONDANSETRON 4 MG PO TBDP: 4.0000 mg | ORAL_TABLET | Freq: Three times a day (TID) | ORAL | 0 refills | Status: DC | PRN

## 2017-07-30 MED ORDER — ONDANSETRON HCL 4 MG/2ML IJ SOLN
INTRAMUSCULAR | Status: AC
  Filled 2017-07-30: qty 2

## 2017-07-30 MED ORDER — IOPAMIDOL (ISOVUE-300) INJECTION 61%
30.0000 mL | Freq: Once | INTRAVENOUS | Status: AC | PRN
  Administered 2017-07-30: 30 mL via ORAL

## 2017-07-30 MED ORDER — SODIUM CHLORIDE 0.9 % IV BOLUS (SEPSIS)
1000.0000 mL | Freq: Once | INTRAVENOUS | Status: AC
  Administered 2017-07-30: 1000 mL via INTRAVENOUS

## 2017-07-30 MED ORDER — METOCLOPRAMIDE HCL 5 MG/ML IJ SOLN
10.0000 mg | Freq: Once | INTRAMUSCULAR | Status: AC
  Administered 2017-07-30: 10 mg via INTRAVENOUS
  Filled 2017-07-30: qty 2

## 2017-07-30 MED ORDER — MORPHINE SULFATE (PF) 2 MG/ML IV SOLN
2.0000 mg | Freq: Once | INTRAVENOUS | Status: AC
  Administered 2017-07-30: 2 mg via INTRAVENOUS
  Filled 2017-07-30: qty 1

## 2017-07-30 MED ORDER — ONDANSETRON HCL 4 MG/2ML IJ SOLN
4.0000 mg | Freq: Once | INTRAMUSCULAR | Status: AC
  Administered 2017-07-30: 4 mg via INTRAVENOUS

## 2017-07-30 MED ORDER — ONDANSETRON HCL 4 MG/2ML IJ SOLN
4.0000 mg | Freq: Once | INTRAMUSCULAR | Status: AC | PRN
  Administered 2017-07-30: 4 mg via INTRAVENOUS
  Filled 2017-07-30: qty 2

## 2017-07-30 NOTE — Addendum Note (Signed)
Addended by: Tawnya Crook on: 07/30/2017 04:17 PM   Modules accepted: Orders

## 2017-07-30 NOTE — Telephone Encounter (Signed)
Patient was seen this morning by Jae Dire.Patient's husband called and said patient is doing worse. I let Jae Dire know and she advised me to tell patient's husband to take patient to the hospital.  Patient's husband wasn't happy and upset that he would have to pay a $300 co-pay to take patient to the hospital.  I let him know Jae Dire stated she would probably need IV fluids and we can't do that at our office.  Patient's husband hung up.

## 2017-07-30 NOTE — ED Notes (Signed)
Ct notified pt needs 5 more minutes to pump breast milk before ct scan.

## 2017-07-30 NOTE — ED Notes (Signed)
MD Quale at bedside. 

## 2017-07-30 NOTE — Addendum Note (Signed)
Addended by: Alvina Chou on: 07/30/2017 12:46 PM   Modules accepted: Orders

## 2017-07-30 NOTE — ED Notes (Signed)
Patient transported to Ultrasound 

## 2017-07-30 NOTE — ED Triage Notes (Signed)
Pt to ED via POV from MD office for Abdominal pain, N/V x the past few days. Pt states that the last time she vomited was "a while ago". Pt is having generalized abdominal pain and reports that she has been running at home, Tmax 101. Pt is afebrile in triage, VS WNL.

## 2017-07-30 NOTE — ED Notes (Signed)
Pt states she has had one bottle of ct contrast and cannot drink anymore. Pt is currently pumping breast milk, will notify ct when pt is done.

## 2017-07-30 NOTE — Patient Instructions (Addendum)
Complete lab work prior to leaving today. I will notify you of your results once received.   You may take Zofran every 8 hours as needed for nausea and vomiting.  It's very important to stay hydrated with water. Try to slowly increase water intake as tolerated.   Advance your diet as tolerated, start with hydration first.  Please go to the hospital if no improvement in symptoms in 12-24 hours or if you start to feel worse.   It was a pleasure to see you today!

## 2017-07-30 NOTE — ED Notes (Signed)
Called mother baby for a breast pump.

## 2017-07-30 NOTE — Discharge Instructions (Signed)
Follow-up with your regular doctor as soon as possible, also setup a follow-up with Dr. Servando Snare regarding the small cyst seen in your liver today.  Please return to the emergency room right away if you are to develop a fever, severe nausea, your pain becomes severe or worsens, you are unable to keep food down, begin vomiting any dark or bloody fluid, you develop any dark or bloody stools, feel dehydrated, or other new concerns or symptoms arise.

## 2017-07-30 NOTE — Telephone Encounter (Signed)
Noted  

## 2017-07-30 NOTE — Progress Notes (Signed)
Subjective:    Patient ID: [name], female    DOB: [birth year], 36 y.o.   MRN: [id number]  HPI  [name] is a 36 year old female who presents today with a chief complaint of nausea and vomiting. She also reports fevers and abdominal pain.   Her symptoms began two days ago with nausea, vomiting, abdominal pain. She describes her pain as pressure which is generalized in the abdomen. She's had little PO intake of fluids but has been taking sips of water and Gatorade; cannot tolerate any food. She's feeling hungry. She took a laxative yesterday as she thought her abdominal pressure was secondary to constipation. She did have bowel movements with temporary improvement in   Her fevers are running 100-101 on average with her last fever being this morning of 101. She's not taken any tylenol or Advil recently. She denies any sick contacts. No one else in her house hold has symptoms.   Review of Systems  Constitutional: Positive for fatigue and fever.  Respiratory: Negative for cough.   Gastrointestinal: Positive for abdominal pain, constipation, nausea and vomiting. Negative for blood in stool and diarrhea.       Past Medical History:  Diagnosis Date  . Hashimoto's disease   . Migraines   . PONV (postoperative nausea and vomiting)   . Seasonal allergies      Social History   Social History  . Marital status: Married    Spouse name: N/A  . Number of children: N/A  . Years of education: N/A   Occupational History  . Not on file.   Social History Main Topics  . Smoking status: Never Smoker  . Smokeless tobacco: Never Used  . Alcohol use No  . Drug use: No  . Sexual activity: Yes    Birth control/ protection: None   Other Topics Concern  . Not on file   Social History Narrative   Married.   1 child, son.   Highest level of education is Masters.   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   Enjoy being outdoors, hiking.    Past Surgical History:  Procedure  Laterality Date  . KNEE ARTHROSCOPY Right 12/2011   Microfracture  . KNEE ARTHROSCOPY Left 08/2008  . LAPAROSCOPIC UNILATERAL SALPINGECTOMY Right 02/13/2016   Procedure: LAPAROSCOPIC right salpingooophorectomy;  Surgeon: Suzy Bouchard, MD;  Location: [facility] OR[facility];  Service: Gynecology;  Laterality: Right;  . LAPAROSCOPY N/A 02/13/2016   Procedure: LAPAROSCOPY DIAGNOSTIC;  Surgeon: Suzy Bouchard, MD;  Location: [facility] OR[facility];  Service: Gynecology;  Laterality: N/A;  . TONSILLECTOMY AND ADENOIDECTOMY      Family History  Problem Relation Age of Onset  . Cancer Mother        stomach  . Hypertension Father   . Cancer Maternal Grandfather        brain  . Cancer Paternal Grandmother        breast  . Heart disease Paternal Grandfather   . Diabetes Mellitus I Sister   . Thyroid disease Neg Hx     Allergies  Allergen Reactions  . Cephalosporins Nausea And Vomiting    Pt has tolerated IV ampicillin  . Fentanyl Nausea And Vomiting  . Gluten Meal Other (See Comments)  . Oxycodone Nausea And Vomiting and Other (See Comments)    Nausea vomiting and severe light headedness.  Marland Kitchen Penicillins Rash    Reaction as a child. Able to take amoxicillin and ampicillin PO/IV      Current  Outpatient Prescriptions on File Prior to Visit  Medication Sig Dispense Refill  . acetaminophen (TYLENOL) 325 MG tablet Take 2 tablets (650 mg total) by mouth every 4 (four) hours as needed (for pain scale < 4). 30 tablet 0  . ASTRAGALUS PO Take 1 capsule by mouth 3 (three) times daily.    . benzocaine-Menthol (DERMOPLAST) 20-0.5 % AERO Apply 1 application topically as needed for irritation (perineal discomfort). 1 each 1  . Beta Glucan POWD 2 capsules by Does not apply route 2 (two) times daily.    . butalbital-acetaminophen-caffeine (FIORICET, ESGIC) 50-325-40 MG tablet Take 1 tablet by mouth every 4 (four) hours as needed for headache or migraine (per CNM Q4-6 hrs prn). 14 tablet 0  .  Butalbital-APAP-Caffeine 50-325-40 MG per capsule Take 1-2 capsules by mouth every 6 (six) hours as needed for headache. 30 capsule 3  . Cholecalciferol (VITAMIN D PO) Take 1 capsule by mouth daily.    Marland Kitchen ibuprofen (ADVIL,MOTRIN) 600 MG tablet Take 1 tablet (600 mg total) by mouth every 6 (six) hours. 30 tablet 0  . Prenatal Vit-Fe Fumarate-FA (PRENATAL MULTIVITAMIN) TABS tablet Take 1 tablet by mouth daily at 12 noon.    . Probiotic Product (PROBIOTIC DAILY PO) Take 1 capsule by mouth daily.     No current facility-administered medications on file prior to visit.     BP 120/78   Pulse 78   Temp 98 F (36.7 C) (Oral)   SpO2 98%   Breastfeeding? Yes    Objective:   Physical Exam  Constitutional: She appears ill.  Neck: Neck supple.  Cardiovascular: Normal rate and regular rhythm.   Pulmonary/Chest: Effort normal and breath sounds normal.  Abdominal: Normal appearance and bowel sounds are normal. There is tenderness in the epigastric area, periumbilical area and left upper quadrant. There is no guarding, no tenderness at McBurney's point and negative Murphy's sign.  Skin: Skin is warm and dry.          Assessment & Plan:  Nausea and Vomiting:  Also with generalized abdominal pain.  Appears ill with dry heaves during visit. Vitals do not indicate systemic dehydration. Suspect viral involvement. Doubt acute appendicitis given lack of abdominal pain and tenderness to RLQ. Check CBC, CMP (renal function) Lipase (epigastric tenderness). Rx for Zofran provided, first dose given in office today.  Stressed the importance of hydration. Discussed that she will need to go to the hospital if no improvement in 12 hours.  Will await labs.  Morrie Sheldon, NP

## 2017-07-30 NOTE — ED Provider Notes (Signed)
[facility] Emergency Department Provider Note   ____________________________________________   First MD Initiated Contact with Patient 07/30/17 1650     (approximate)  I have reviewed the triage vital signs and the nursing notes.   HISTORY  Chief Complaint Abdominal Pain; Emesis; and Nausea    HPI Shanikqua Zarzycki Ralph Leyden is a 36 y.o. female experiencing 3 days of abdominal discomfort primarily in the upper abdomen. It comes and goes, somewhat hard describe in nature. She reports she has had nausea with multiple rounds of vomiting. No diarrhea. Currently experiencing most pain in the upper abdomen. She's had fever at home, has not been able to keep any food or medications down for about 2 days. Saw her doctor who did labs and advised her may be a viral illness.  currently reports moderate to severe abdominal discomfort. Primarily seen in the upper abdomen. Denies pregnancy. Has a history of a previous ectopic, denies any vaginal discomfort or pelvic concerns at this time   Past Medical History:  Diagnosis Date  . Hashimoto's disease   . Migraines   . PONV (postoperative nausea and vomiting)   . Seasonal allergies     Patient Active Problem List   Diagnosis Date Noted  . Postpartum care following vaginal delivery 4/3 02/02/2017  . Obstetrical laceration 2nd deg vaginal 02/02/2017  . Ectopic pregnancy of ovary 02/13/2016  . Wellness examination 11/06/2015  . Hyperglycemia 11/06/2015  . Abnormal thyroid function test 05/01/2015  . Acute mastitis of right breast 12/20/2014  . Acute mastitis of left breast 11/09/2014  . Constipation 11/07/2014  . Migraine with aura 06/04/2014  . Derangement of lateral meniscus 12/21/2011  . Arthralgia of lower leg 11/20/2011  . Allergic rhinitis 10/13/2010    Past Surgical History:  Procedure Laterality Date  . KNEE ARTHROSCOPY Right 12/2011   Microfracture  . KNEE ARTHROSCOPY Left 08/2008  . LAPAROSCOPIC  UNILATERAL SALPINGECTOMY Right 02/13/2016   Procedure: LAPAROSCOPIC right salpingooophorectomy;  Surgeon: Suzy Bouchard, MD;  Location: [facility] OR[facility];  Service: Gynecology;  Laterality: Right;  . LAPAROSCOPY N/A 02/13/2016   Procedure: LAPAROSCOPY DIAGNOSTIC;  Surgeon: Suzy Bouchard, MD;  Location: [facility] ORS;  Service: Gynecology;  Laterality: N/A;  . TONSILLECTOMY AND ADENOIDECTOMY      Prior to Admission medications   Medication Sig Start Date End Date Taking? Authorizing Provider  Prenatal Vit-Fe Fumarate-FA (PRENATAL MULTIVITAMIN) TABS tablet Take 1 tablet by mouth daily at 12 noon.   Yes [provider]  acetaminophen (TYLENOL) 325 MG tablet Take 2 tablets (650 mg total) by mouth every 4 (four) hours as needed (for pain scale < 4). 02/03/17   Shea Evans, MD  benzocaine-Menthol (DERMOPLAST) 20-0.5 % AERO Apply 1 application topically as needed for irritation (perineal discomfort). Patient not taking: Reported on 07/30/2017 02/03/17   Shea Evans, MD  butalbital-acetaminophen-caffeine (FIORICET, ESGIC) [phone] MG tablet Take 1 tablet by mouth every 4 (four) hours as needed for headache or migraine (per CNM Q4-6 hrs prn). 02/03/17   Shea Evans, MD  Butalbital-APAP-Caffeine [phone] MG per capsule Take 1-2 capsules by mouth every 6 (six) hours as needed for headache. Patient not taking: Reported on 07/30/2017 05/07/14   Tereso Newcomer, MD  ibuprofen (ADVIL,MOTRIN) 600 MG tablet Take 1 tablet (600 mg total) by mouth every 6 (six) hours. Patient not taking: Reported on 07/30/2017 02/03/17   Shea Evans, MD  ondansetron (ZOFRAN ODT) 4 MG disintegrating tablet Take 1 tablet (4 mg total) by mouth every 6 (six) hours  as needed for nausea or vomiting. 07/30/17   Sharyn Creamer, MD    Allergies Cephalosporins; Fentanyl; Gluten meal; Oxycodone; and Penicillins  Family History  Problem Relation Age of Onset  . Cancer Mother        stomach  . Hypertension Father   . Cancer  Maternal Grandfather        brain  . Cancer Paternal Grandmother        breast  . Heart disease Paternal Grandfather   . Diabetes Mellitus I Sister   . Thyroid disease Neg Hx     Social History Social History  Substance Use Topics  . Smoking status: Never Smoker  . Smokeless tobacco: Never Used  . Alcohol use No    Review of Systems Constitutional: fevers and chills and fatigueEyes: No visual changes. ENT: No sore throat. Cardiovascular: Denies chest pain. Respiratory: Denies shortness of breath. Gastrointestinal: see history of present illnessGenitourinary: Negative for dysuria. Musculoskeletal: Negative for back pain. Skin: Negative for rash. Neurological: Negative for headaches, focal weakness or numbness.    ____________________________________________   PHYSICAL EXAM:  VITAL SIGNS: ED Triage Vitals [07/30/17 1618]  Enc Vitals Group     BP 133/88     Pulse Rate 76     Resp 16     Temp 98.6 F (37 C)     Temp Source Oral     SpO2 99 %     Weight      Height      Head Circumference      Peak Flow      Pain Score 5     Pain Loc      Pain Edu?      Excl. in GC?     Constitutional: Alert and oriented. moderately ill-appearing, in no acute distress but appears generally fatigued and generally ill. Laying flat on the bed. Reports movement makes pain worse in the abdomen Eyes: Conjunctivae are normal. Head: Atraumatic. Nose: No congestion/rhinnorhea. Mouth/Throat: Mucous membranes are dry. Neck: No stridor.   Cardiovascular: Normal rate, regular rhythm. Grossly normal heart sounds.  Good peripheral circulation. Respiratory: Normal respiratory effort.  No retractions. Lungs CTAB. Gastrointestinal: Soft and nontenderthrough the lower abdomen, but reports mild tenderness in the epigastrium with a sharp discomfort to palpation in the right upper abdomen and a equivocal Murphy. No distention. Musculoskeletal: No lower extremity tenderness nor edema. Neurologic:   Normal speech and language. No gross focal neurologic deficits are appreciated.  Skin:  Skin is warm, dry and intact. No rash noted. Psychiatric: Mood and affect are normal. Speech and behavior are normal.  ____________________________________________   LABS (all labs ordered are listed, but only abnormal results are displayed)  Labs Reviewed  COMPREHENSIVE METABOLIC PANEL - Abnormal; Notable for the following:       Result Value   Glucose, Bld 101 (*)    Calcium 8.6 (*)    AST 14 (*)    All other components within normal limits  URINALYSIS, COMPLETE (UACMP) WITH MICROSCOPIC - Abnormal; Notable for the following:    Color, Urine YELLOW (*)    APPearance HAZY (*)    Ketones, ur 20 (*)    Leukocytes, UA SMALL (*)    Bacteria, UA RARE (*)    Squamous Epithelial / LPF 6-30 (*)    All other components within normal limits  LIPASE, BLOOD  CBC  HCG, QUANTITATIVE, PREGNANCY  POC URINE PREG, ED   ____________________________________________  EKG   ____________________________________________  RADIOLOGY  Ct Abdomen  Pelvis W Contrast  Result Date: 07/30/2017 CLINICAL DATA:  Acute generalized abdominal pain. EXAM: CT ABDOMEN AND PELVIS WITH CONTRAST TECHNIQUE: Multidetector CT imaging of the abdomen and pelvis was performed using the standard protocol following bolus administration of intravenous contrast. CONTRAST:  ISOVUE-300 IOPAMIDOL (ISOVUE-300) INJECTION 61% COMPARISON:  None. FINDINGS: Lower chest: No acute abnormality. Hepatobiliary: No focal liver abnormality is seen. No gallstones, gallbladder wall thickening, or biliary dilatation. Pancreas: Unremarkable. No pancreatic ductal dilatation or surrounding inflammatory changes. Spleen: Normal in size without focal abnormality. Adrenals/Urinary Tract: Adrenal glands appear normal. Small bilateral renal cysts are noted. No hydronephrosis or renal obstruction is noted. Urinary bladder is unremarkable. Stomach/Bowel: The stomach  appears normal. There is no evidence of bowel obstruction or inflammation. The appendix is not identified. Vascular/Lymphatic: No significant vascular findings are present. No enlarged abdominal or pelvic lymph nodes. Reproductive: Uterus and bilateral adnexa are unremarkable. Other: No abdominal wall hernia or abnormality. No abdominopelvic ascites. Musculoskeletal: No acute or significant osseous findings. IMPRESSION: No acute abnormality seen in the abdomen or pelvis. Electronically Signed   By: Lupita Raider, M.D.   On: 07/30/2017 21:03   US Abdomen Limited Ruq  Result Date: 07/30/2017 CLINICAL DATA:  Abdominal pain x2 days with nausea and vomiting. EXAM: ULTRASOUND ABDOMEN LIMITED RIGHT UPPER QUADRANT COMPARISON:  None. FINDINGS: Gallbladder: Tiny nonobstructing echogenic calcification within the gallbladder. No gallbladder wall thickening or pericholecystic fluid. No sonographic Murphy sign was elicited by the technologist. Common bile duct: Diameter: 2 mm in normal Liver: Left hepatic hypoechoic lesion with internal echoes measuring approximately 1 cm in diameter but without demonstrable vascularity likely to represent a complex cyst. Within normal limits in parenchymal echogenicity. Portal vein is patent on color Doppler imaging with normal direction of blood flow towards the liver. Portal vein is patent with normal directional flow to ports the liver. IMPRESSION: 1. Punctate nonobstructing calculus is noted in the gallbladder without secondary signs of acute cholecystitis. 2. Hypoechoic, avascular 1 cm lesion in the left hepatic lobe with internal debris situated near the dome consistent with a complex cyst. Electronically Signed   By: Tollie Eth M.D.   On: 07/30/2017 18:15     CT negative for acute. ____________________________________________   PROCEDURES  Procedure(s) performed: None  Procedures  Critical Care performed: No  ____________________________________________   INITIAL  IMPRESSION / ASSESSMENT AND PLAN / ED COURSE  Pertinent labs & imaging results that were available during my care of the patient were reviewed by me and considered in my medical decision making (see chart for details).  Differential diagnosis includes but is not limited to, abdominal perforation, aortic dissection, cholecystitis, appendicitis, diverticulitis, colitis, esophagitis/gastritis, kidney stone, pyelonephritis, urinary tract infection. patient denies gynecologic symptoms  All are considered in decision and treatment plan. Based upon the patient's presentation and risk factors, we'll proceed with right upper quadrant as well as pain and nausea control and hydration. Start with ultrasound, if this is negative for cholecystitis or obvious causation and I anticipate CT of the abdomen and pelvis. HCG negative   Clinical Course as of Jul 31 2139  Fri Jul 30, 2017  2127 Spoke with Dr. Servando Snare (GI). Recommends follow-up with GI as an outpatient, but enquire if travel history.   [MQ]    Clinical Course User Index [MQ] Sharyn Creamer, MD   ----------------------------------------- 9:39 PM on 07/30/2017 -----------------------------------------  Patient imaging reassuring. Patient reports she has traveled to [place] but not for 15 years. There is low risk  for parasitic cyst, and discussed with Dr. wall who advises close outpatient follow-up in any case.  Patient reports she is feeling improved, no longer having vomiting. Pain is well-controlled, and she feels much better. Nontoxic and well-appearing. Return precautions and treatment recommendations and follow-up discussed with the patient who is agreeable with the plan.   ____________________________________________   FINAL CLINICAL IMPRESSION(S) / ED DIAGNOSES  Final diagnoses:  Abdominal pain  Acute gastritis without hemorrhage, unspecified gastritis type  Nausea and vomiting, intractability of vomiting not  specified, unspecified vomiting type  Liver cyst      NEW MEDICATIONS STARTED DURING THIS VISIT:  New Prescriptions   ONDANSETRON (ZOFRAN ODT) 4 MG DISINTEGRATING TABLET    Take 1 tablet (4 mg total) by mouth every 6 (six) hours as needed for nausea or vomiting.     Note:  This document was prepared using Dragon voice recognition software and may include unintentional dictation errors.     Sharyn Creamer, MD 07/30/17 2140

## 2017-07-30 NOTE — ED Notes (Signed)
First nurse note  Presents with family with abd pain and n/v  Sent in by PCP

## 2017-08-01 ENCOUNTER — Emergency Department
Admission: EM | Admit: 2017-08-01 | Discharge: 2017-08-01 | Disposition: A | Discharge status: Home / Self Care | Payer: BLUE CROSS/BLUE SHIELD | Attending: Emergency Medicine | Admitting: Emergency Medicine

## 2017-08-01 ENCOUNTER — Encounter: Payer: Self-pay | Admitting: Emergency Medicine

## 2017-08-01 DIAGNOSIS — E063 Autoimmune thyroiditis: Secondary | ICD-10-CM | POA: Diagnosis not present

## 2017-08-01 DIAGNOSIS — R112 Nausea with vomiting, unspecified: Secondary | ICD-10-CM | POA: Diagnosis not present

## 2017-08-01 LAB — COMPREHENSIVE METABOLIC PANEL
ALK PHOS: 68 U/L (ref 38–126)
ALT: 17 U/L (ref 14–54)
AST: 17 U/L (ref 15–41)
Albumin: 4.9 g/dL (ref 3.5–5.0)
Anion gap: 11 (ref 5–15)
BILIRUBIN TOTAL: 1.3 mg/dL — AB (ref 0.3–1.2)
BUN: 19 mg/dL (ref 6–20)
CALCIUM: 9.6 mg/dL (ref 8.9–10.3)
CO2: 23 mmol/L (ref 22–32)
CREATININE: 0.83 mg/dL (ref 0.44–1.00)
Chloride: 101 mmol/L (ref 101–111)
GFR calc Af Amer: >60 mL/min (ref >60)
GLUCOSE: 97 mg/dL (ref 65–99)
Potassium: 4.3 mmol/L (ref 3.5–5.1)
Sodium: 135 mmol/L (ref 135–145)
TOTAL PROTEIN: 8.4 g/dL — AB (ref 6.5–8.1)

## 2017-08-01 LAB — URINALYSIS, COMPLETE (UACMP) WITH MICROSCOPIC
Bacteria, UA: NONE SEEN
Bilirubin Urine: NEGATIVE
GLUCOSE, UA: NEGATIVE mg/dL
HGB URINE DIPSTICK: NEGATIVE
Ketones, ur: 80 mg/dL — AB
LEUKOCYTES UA: NEGATIVE
NITRITE: NEGATIVE
PROTEIN: NEGATIVE mg/dL
SPECIFIC GRAVITY, URINE: 1.02 (ref 1.005–1.030)
pH: 6 (ref 5.0–8.0)

## 2017-08-01 LAB — CBC
HCT: 46.2 % (ref 35.0–47.0)
Hemoglobin: 16 g/dL (ref 12.0–16.0)
MCH: 29.3 pg (ref 26.0–34.0)
MCHC: 34.6 g/dL (ref 32.0–36.0)
MCV: 84.7 fL (ref 80.0–100.0)
PLATELETS: 253 10*3/uL (ref 150–440)
RBC: 5.45 MIL/uL — ABNORMAL HIGH (ref 3.80–5.20)
RDW: 12.2 % (ref 11.5–14.5)
WBC: 7.6 10*3/uL (ref 3.6–11.0)

## 2017-08-01 LAB — LIPASE, BLOOD: Lipase: 54 U/L — ABNORMAL HIGH (ref 11–51)

## 2017-08-01 LAB — POCT PREGNANCY, URINE: PREG TEST UR: NEGATIVE

## 2017-08-01 MED ORDER — ONDANSETRON 4 MG PO TBDP: 4.0000 mg | ORAL_TABLET | Freq: Three times a day (TID) | ORAL | 0 refills | Status: DC | PRN

## 2017-08-01 MED ORDER — METOCLOPRAMIDE HCL 5 MG/ML IJ SOLN
10.0000 mg | Freq: Once | INTRAMUSCULAR | Status: AC
  Administered 2017-08-01: 10 mg via INTRAVENOUS
  Filled 2017-08-01: qty 2

## 2017-08-01 MED ORDER — SODIUM CHLORIDE 0.9 % IV BOLUS (SEPSIS)
1000.0000 mL | Freq: Once | INTRAVENOUS | Status: AC
  Administered 2017-08-01: 1000 mL via INTRAVENOUS

## 2017-08-01 MED ORDER — ONDANSETRON HCL 4 MG/2ML IJ SOLN
4.0000 mg | Freq: Once | INTRAMUSCULAR | Status: AC
  Administered 2017-08-01: 4 mg via INTRAVENOUS
  Filled 2017-08-01: qty 2

## 2017-08-01 NOTE — ED Notes (Signed)
poc preg and urine signed off by this nurse but previously sent

## 2017-08-01 NOTE — ED Notes (Signed)
Pt states feels much better with just an ocassional burp. 900/1000 cc of fluid in.

## 2017-08-01 NOTE — ED Provider Notes (Signed)
[facility] Emergency Department Provider Note  Time seen: 8:32 AM  I have reviewed the triage vital signs and the nursing notes.   HISTORY  Chief Complaint Emesis    HPI Sanae Willetts Ralph Leyden is a 36 y.o. female With a past medical history of migraines, past ectopic pregnancy, presents to the emergency department for nausea and vomiting. According to the patient for the past 5 days she has been nauseated with frequent episodes of vomiting with upper abdominal discomfort. Patient was seen in the emergency department 2 days ago with a fairly comprehensive workup performed including labs CT and ultrasound showing no significant acute abnormalities at that time.  patient states since going home she is continued with nausea and several episodes of vomiting, states she is having trouble keeping fluids down which is problematic as she is currently breast-feeding her child. Denies diarrhea. Denies dysuria. Has not had her menstrual cycle since having a baby 6 months ago. Denies fever.  Past Medical History:  Diagnosis Date  . Hashimoto's disease   . Migraines   . PONV (postoperative nausea and vomiting)   . Seasonal allergies     Patient Active Problem List   Diagnosis Date Noted  . Postpartum care following vaginal delivery 4/3 02/02/2017  . Obstetrical laceration 2nd deg vaginal 02/02/2017  . Ectopic pregnancy of ovary 02/13/2016  . Wellness examination 11/06/2015  . Hyperglycemia 11/06/2015  . Abnormal thyroid function test 05/01/2015  . Acute mastitis of right breast 12/20/2014  . Acute mastitis of left breast 11/09/2014  . Constipation 11/07/2014  . Migraine with aura 06/04/2014  . Derangement of lateral meniscus 12/21/2011  . Arthralgia of lower leg 11/20/2011  . Allergic rhinitis 10/13/2010    Past Surgical History:  Procedure Laterality Date  . KNEE ARTHROSCOPY Right 12/2011   Microfracture  . KNEE ARTHROSCOPY Left 08/2008  . LAPAROSCOPIC UNILATERAL  SALPINGECTOMY Right 02/13/2016   Procedure: LAPAROSCOPIC right salpingooophorectomy;  Surgeon: Suzy Bouchard, MD;  Location: [facility] ORS;  Service: Gynecology;  Laterality: Right;  . LAPAROSCOPY N/A 02/13/2016   Procedure: LAPAROSCOPY DIAGNOSTIC;  Surgeon: Suzy Bouchard, MD;  Location: [facility] ORS;  Service: Gynecology;  Laterality: N/A;  . TONSILLECTOMY AND ADENOIDECTOMY      Prior to Admission medications   Medication Sig Start Date End Date Taking? Authorizing Provider  acetaminophen (TYLENOL) 325 MG tablet Take 2 tablets (650 mg total) by mouth every 4 (four) hours as needed (for pain scale < 4). 02/03/17   Shea Evans, MD  benzocaine-Menthol (DERMOPLAST) 20-0.5 % AERO Apply 1 application topically as needed for irritation (perineal discomfort). Patient not taking: Reported on 07/30/2017 02/03/17   Shea Evans, MD  butalbital-acetaminophen-caffeine (FIORICET, ESGIC) [phone] MG tablet Take 1 tablet by mouth every 4 (four) hours as needed for headache or migraine (per CNM Q4-6 hrs prn). 02/03/17   Shea Evans, MD  Butalbital-APAP-Caffeine [phone] MG per capsule Take 1-2 capsules by mouth every 6 (six) hours as needed for headache. Patient not taking: Reported on 07/30/2017 05/07/14   Tereso Newcomer, MD  ibuprofen (ADVIL,MOTRIN) 600 MG tablet Take 1 tablet (600 mg total) by mouth every 6 (six) hours. Patient not taking: Reported on 07/30/2017 02/03/17   Shea Evans, MD  ondansetron (ZOFRAN ODT) 4 MG disintegrating tablet Take 1 tablet (4 mg total) by mouth every 6 (six) hours as needed for nausea or vomiting. 07/30/17   Sharyn Creamer, MD  Prenatal Vit-Fe Fumarate-FA (PRENATAL MULTIVITAMIN) TABS tablet Take 1 tablet by mouth daily at  12 noon.    [provider]    Allergies  Allergen Reactions  . Cephalosporins Nausea And Vomiting    Pt has tolerated IV ampicillin  . Fentanyl Nausea And Vomiting  . Gluten Meal Other (See Comments)  . Oxycodone Nausea And Vomiting and  Other (See Comments)    Nausea vomiting and severe light headedness.  Marland Kitchen Penicillins Rash    Has patient had a PCN reaction causing immediate rash, facial/tongue/throat swelling, SOB or lightheadedness with hypotension: No Has patient had a PCN reaction causing severe rash involving mucus membranes or skin necrosis: No Has patient had a PCN reaction that required hospitalization: No Has patient had a PCN reaction occurring within the last 10 years: No If all of the above answers are "NO", then may proceed with Cephalosporin use  Pt states she can tolerate ampicillin      Family History  Problem Relation Age of Onset  . Cancer Mother        stomach  . Hypertension Father   . Cancer Maternal Grandfather        brain  . Cancer Paternal Grandmother        breast  . Heart disease Paternal Grandfather   . Diabetes Mellitus I Sister   . Thyroid disease Neg Hx     Social History Social History  Substance Use Topics  . Smoking status: Never Smoker  . Smokeless tobacco: Never Used  . Alcohol use No    Review of Systems Constitutional: Negative for fever. Cardiovascular: Negative for chest pain. Respiratory: Negative for shortness of breath. Gastrointestinal: mild upper abdominal pain/cramping. Positive for nausea and vomiting. Negative for diarrhea. Genitourinary: Negative for dysuria. Neurological: Negative for headache All other ROS negative  ____________________________________________   PHYSICAL EXAM:  VITAL SIGNS: ED Triage Vitals  Enc Vitals Group     BP 08/01/17 0824 128/86     Pulse Rate 08/01/17 0824 98     Resp 08/01/17 0824 16     Temp 08/01/17 0824 98.5 F (36.9 C)     Temp Source 08/01/17 0824 Oral     SpO2 08/01/17 0824 97 %     Weight 08/01/17 0825 145 lb (65.8 kg)     Height 08/01/17 0825  (1.676 m)     Head Circumference --      Peak Flow --      Pain Score 08/01/17 0824 3     Pain Loc --      Pain Edu? --      Excl. in GC? --      Constitutional: Alert and oriented. Well appearing and in no distress. Eyes: Normal exam ENT   Head: Normocephalic and atraumatic.   Mouth/Throat: dry mucous membranes Cardiovascular: Normal rate, regular rhythm. No murmur Respiratory: Normal respiratory effort without tachypnea nor retractions. Breath sounds are clear  Gastrointestinal: soft, mild diffuse upper abdominal tenderness, no rebound or guarding. No distention. No lower abdominal tenderness. Musculoskeletal: Nontender with normal range of motion in all extremities.  Neurologic:  Normal speech and language. No gross focal neurologic deficits  Skin:  Skin is warm, dry and intact.  Psychiatric: Mood and affect are normal.   ____________________________________________    INITIAL IMPRESSION / ASSESSMENT AND PLAN / ED COURSE  Pertinent labs & imaging results that were available during my care of the patient were reviewed by me and considered in my medical decision making (see chart for details).  The patient presents to the emergency department for continued nausea  vomiting for the past 4-5 days. Denies diarrhea, does state mild abdominal discomfort across her upper abdomen. Denies diarrhea or fever. Differential this time would include gastroenteritis, enteritis, less likely biliary disease given normal ultrasound 2 days ago, less likely pancreatitis given normal labs 2 days ago. We will check labs, IV hydrate, treat nausea and closely monitor in the emergency department.  patient is feeling much better and is asking to be discharged home. I discussed with the patient possible mild pancreatitis. Continue supportive care at home including fluids, Zofran to be used as needed follow-up with her doctor. I discussed return precautions for worsening pain, vomiting unable to keep down fluids.  ____________________________________________   FINAL CLINICAL IMPRESSION(S) / ED DIAGNOSES  nausea vomiting    Minna Antis,  MD 08/01/17 1210

## 2017-08-01 NOTE — ED Triage Notes (Signed)
Pt to ED via POV c/o Vomiting. Pt was seen on Friday for the same. Pt states that the vomiting has not gotten any better. Pt reports vomiting approximately 12 times in the last 24 hours.

## 2017-09-21 ENCOUNTER — Ambulatory Visit: Payer: BLUE CROSS/BLUE SHIELD | Admitting: Gastroenterology

## 2017-09-21 ENCOUNTER — Encounter: Payer: Self-pay | Admitting: Gastroenterology

## 2017-09-21 VITALS — BP 112/77 | HR 90 | Ht 66.0 in | Wt 149.6 lb

## 2017-09-21 DIAGNOSIS — K7689 Other specified diseases of liver: Secondary | ICD-10-CM | POA: Diagnosis not present

## 2017-09-21 NOTE — Progress Notes (Signed)
Gastroenterology Consultation  Referring Provider:     Doreene Nestlark, Katherine K, NP Primary Care Physician:  Doreene Nestlark, Katherine K, NP Primary Gastroenterologist:  Dr. Servando SnareWohl     Reason for Consultation:     Liver cyst        HPI:   [name] is a 36 y.o. y/o female referred for consultation & management of Liver cyst by Dr. Chestine Sporelark, Keane ScrapeKatherine K, NP.  This patient comes in today after having what appears to be a viral illness with nausea and vomiting with the symptoms lasting for 2 weeks.  The patient underwent an ultrasound that showed her to have a cyst in the liver that was thought to be complex.  The patient then underwent a CT scan that did not show any signs of the cyst found on ultrasound.  The radiologist was called and I spoke to him today who states that the liver cysts was borderline complex and may even be just a simple cyst.  The patient states her nausea and vomiting and viral illness has resolved.  She has no abdominal pain nausea vomiting fevers or chills.  She also denies any change in bowel habits or unexplained weight loss.  Past Medical History:  Diagnosis Date  . Hashimoto's disease   . Migraines   . PONV (postoperative nausea and vomiting)   . Seasonal allergies     Past Surgical History:  Procedure Laterality Date  . KNEE ARTHROSCOPY Right 12/2011   Microfracture  . KNEE ARTHROSCOPY Left 08/2008  . LAPAROSCOPIC UNILATERAL SALPINGECTOMY Right 02/13/2016   Procedure: LAPAROSCOPIC right salpingooophorectomy;  Surgeon: Suzy Bouchardhomas J Schermerhorn, MD;  Location: [facility] ORS;  Service: Gynecology;  Laterality: Right;  . LAPAROSCOPY N/A 02/13/2016   Procedure: LAPAROSCOPY DIAGNOSTIC;  Surgeon: Suzy Bouchardhomas J Schermerhorn, MD;  Location: [facility] ORS;  Service: Gynecology;  Laterality: N/A;  . TONSILLECTOMY AND ADENOIDECTOMY      Prior to Admission medications   Medication Sig Start Date End Date Taking? Authorizing Provider  acetaminophen (TYLENOL) 325 MG tablet Take 2 tablets (650 mg  total) by mouth every 4 (four) hours as needed (for pain scale < 4). 02/03/17  Yes Shea EvansMody, Vaishali, MD  butalbital-acetaminophen-caffeine (FIORICET, ESGIC) 50-325-40 MG tablet Take 1 tablet by mouth every 4 (four) hours as needed for headache or migraine (per CNM Q4-6 hrs prn). 02/03/17  Yes Shea EvansMody, Vaishali, MD  ondansetron (ZOFRAN ODT) 4 MG disintegrating tablet Take 1 tablet (4 mg total) by mouth every 8 (eight) hours as needed for nausea or vomiting. 08/01/17  Yes Minna AntisPaduchowski, Kevin, MD  Prenatal Vit-Fe Fumarate-FA (PRENATAL MULTIVITAMIN) TABS tablet Take 1 tablet by mouth daily at 12 noon.   Yes [provider]  benzocaine-Menthol (DERMOPLAST) 20-0.5 % AERO Apply 1 application topically as needed for irritation (perineal discomfort). Patient not taking: Reported on 07/30/2017 02/03/17   Shea EvansMody, Vaishali, MD  Butalbital-APAP-Caffeine [phone] MG per capsule Take 1-2 capsules by mouth every 6 (six) hours as needed for headache. Patient not taking: Reported on 07/30/2017 05/07/14   Tereso NewcomerAnyanwu, Ugonna A, MD  ibuprofen (ADVIL,MOTRIN) 600 MG tablet Take 1 tablet (600 mg total) by mouth every 6 (six) hours. Patient not taking: Reported on 07/30/2017 02/03/17   Shea EvansMody, Vaishali, MD    Family History  Problem Relation Age of Onset  . Cancer Mother        stomach  . Hypertension Father   . Cancer Maternal Grandfather        brain  . Cancer Paternal Grandmother  breast  . Heart disease Paternal Grandfather   . Diabetes Mellitus I Sister   . Thyroid disease Neg Hx      Social History   Tobacco Use  . Smoking status: Never Smoker  . Smokeless tobacco: Never Used  Substance Use Topics  . Alcohol use: No    Alcohol/week: 0.0 oz  . Drug use: No    Allergies as of 09/21/2017 - Review Complete 09/21/2017  Allergen Reaction Noted  . Cephalosporins Nausea And Vomiting 05/07/2014  . Fentanyl Nausea And Vomiting 11/07/2014  . Gluten meal Other (See Comments) 02/13/2016  . Oxycodone Nausea And  Vomiting and Other (See Comments) 11/07/2014  . Penicillins Rash 05/07/2014    Review of Systems:    All systems reviewed and negative except where noted in HPI.   Physical Exam:  BP 112/77 (BP Location: Right Arm, Patient Position: Sitting, Cuff Size: Normal)   Pulse 90   Ht 5\' 6"  (1.676 m)   Wt 149 lb 9.6 oz (67.9 kg)   BMI 24.15 kg/m  No LMP recorded. Psych:  Alert and cooperative. Normal mood and affect. General:   Alert,  Well-developed, well-nourished, pleasant and cooperative in NAD Head:  Normocephalic and atraumatic. Eyes:  Sclera clear, no icterus.   Conjunctiva pink. Ears:  Normal auditory acuity. Nose:  No deformity, discharge, or lesions. Mouth:  No deformity or lesions,oropharynx pink & moist. Neck:  Supple; no masses or thyromegaly. Lungs:  Respirations even and unlabored.  Clear throughout to auscultation.   No wheezes, crackles, or rhonchi. No acute distress. Heart:  Regular rate and rhythm; no murmurs, clicks, rubs, or gallops. Abdomen:  Normal bowel sounds.  No bruits.  Soft, non-tender and non-distended without masses, hepatosplenomegaly or hernias noted.  No guarding or rebound tenderness.  Negative Carnett sign.   Rectal:  Deferred.  Msk:  Symmetrical without gross deformities.  Good, equal movement & strength bilaterally. Pulses:  Normal pulses noted. Extremities:  No clubbing or edema.  No cyanosis. Neurologic:  Alert and oriented x3;  grossly normal neurologically. Skin:  Intact without significant lesions or rashes.  No jaundice. Lymph Nodes:  No significant cervical adenopathy. Psych:  Alert and cooperative. Normal mood and affect.  Imaging Studies: No results found.  Assessment and Plan:   [name] is a 36 y.o. 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 I spoke to the radiologist who read the CT scan and he reviewed the ultrasound also.  He states that the lesion looks benign and that a repeat ultrasound should  be done in 6 months to see if there is any growth of the cyst.  Otherwise the patient is asymptomatic and will follow up as needed if her nausea vomiting comes back.  The patient has been explained the plan and the need for repeat ultrasound in 6 months and agrees with the plan.  Midge Miniumarren Jaret Coppedge, MD. Clementeen GrahamFACG   Note: This dictation was prepared with Dragon dictation along with smaller phrase technology. Any transcriptional errors that result from this process are unintentional.

## 2017-10-05 ENCOUNTER — Ambulatory Visit: Payer: BLUE CROSS/BLUE SHIELD | Admitting: Gastroenterology

## 2018-03-22 ENCOUNTER — Other Ambulatory Visit: Payer: Self-pay

## 2018-03-22 ENCOUNTER — Telehealth: Payer: Self-pay

## 2018-03-22 DIAGNOSIS — K7689 Other specified diseases of liver: Secondary | ICD-10-CM

## 2018-03-22 NOTE — Telephone Encounter (Signed)
-----   Message from Rayann Heman, CMA sent at 09/21/2017  3:02 PM EST ----- Pt needs 6 month US liver

## 2018-03-22 NOTE — Telephone Encounter (Signed)
Left vm for pt to return my call to schedule repeat US.

## 2018-03-29 ENCOUNTER — Telehealth: Payer: Self-pay

## 2018-03-29 NOTE — Telephone Encounter (Signed)
Pt returned call and was scheduled for her follow up [place] at [facility] outpatient imaging, [place] location on Tuesday, June 4th at 8:00am. Pt was advised to arrive at 7:45am and to be NPO after midnight on Monday night.   I have left a voicemail with these instruction on pt's cell.

## 2018-04-05 ENCOUNTER — Ambulatory Visit
Admission: RE | Admit: 2018-04-05 | Discharge: 2018-04-05 | Disposition: A | Discharge status: Home / Self Care | Payer: BLUE CROSS/BLUE SHIELD | Source: Ambulatory Visit | Attending: Gastroenterology | Admitting: Gastroenterology

## 2018-04-05 ENCOUNTER — Telehealth: Payer: Self-pay

## 2018-04-05 DIAGNOSIS — Z09 Encounter for follow-up examination after completed treatment for conditions other than malignant neoplasm: Secondary | ICD-10-CM | POA: Diagnosis not present

## 2018-04-05 DIAGNOSIS — K7689 Other specified diseases of liver: Secondary | ICD-10-CM | POA: Insufficient documentation

## 2018-04-05 NOTE — Telephone Encounter (Signed)
-----   Message from Midge Miniumarren Wohl, MD sent at 04/05/2018  8:34 AM EDT ----- Let the patient know the liver cysts have not changed and are unlikely of any concern.

## 2018-04-05 NOTE — Telephone Encounter (Signed)
Left vm notifying pt of US results.  

## 2018-04-10 IMAGING — US US ABDOMEN LIMITED
1 series · 14 of 25 positions shown · non-contrast
Comparison: None.

CLINICAL DATA: Abdominal pain x2 days with nausea and vomiting.

EXAM:
ULTRASOUND ABDOMEN LIMITED RIGHT UPPER QUADRANT

[Series 1: us abdomen limited · 0.19mm/px · 59 acquisitions, 14 frames shown]
[im 1/59]
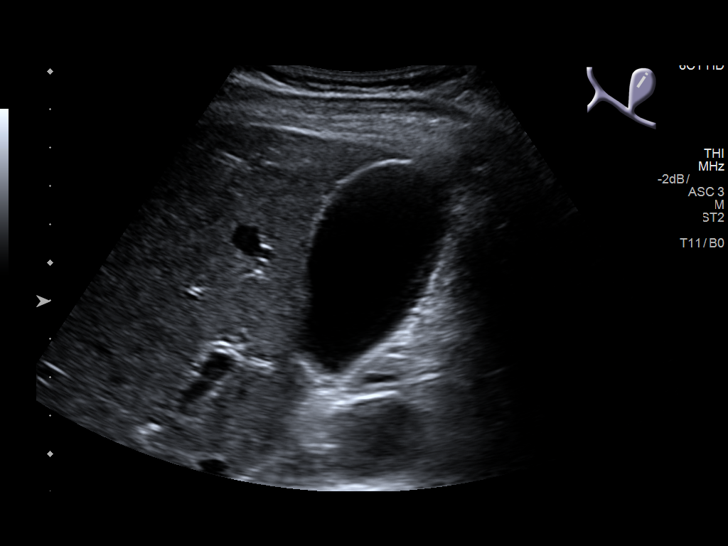
[im 5/59]
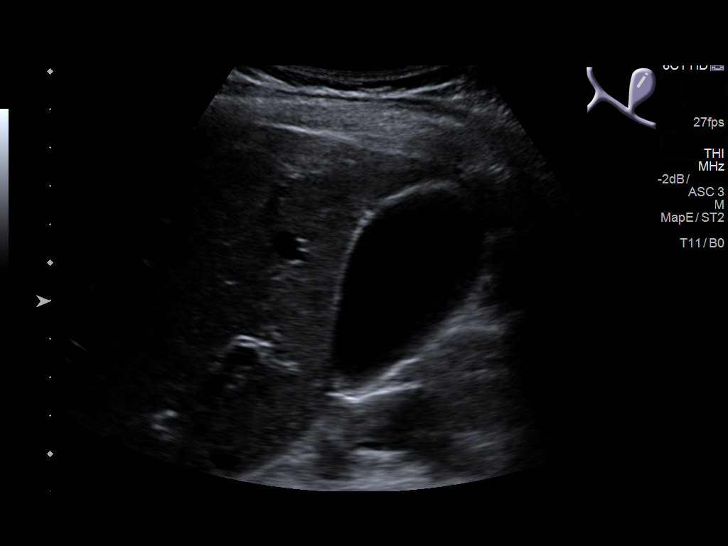
[im 10/59]
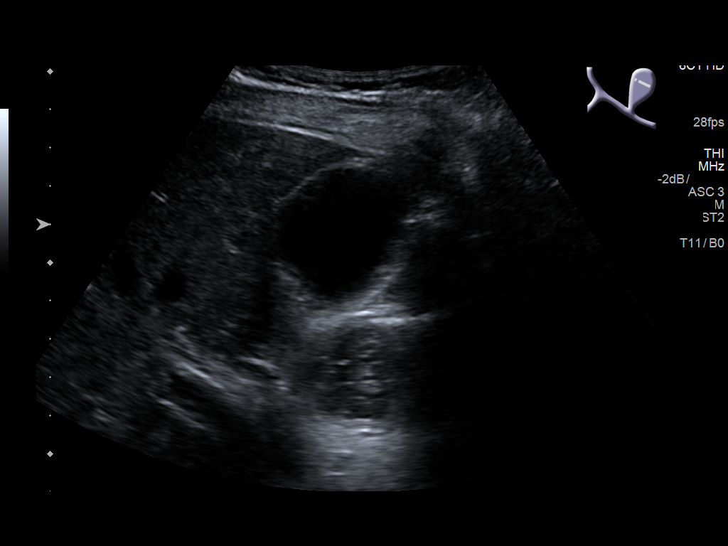
[im 15/59]
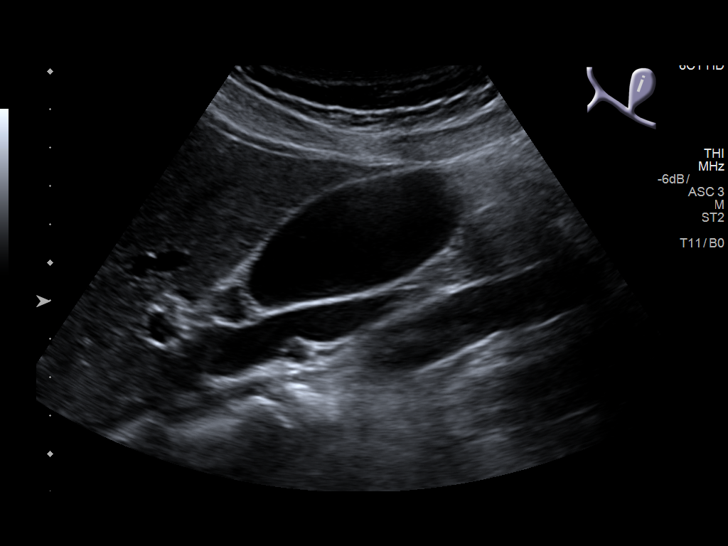
[im 20/59]
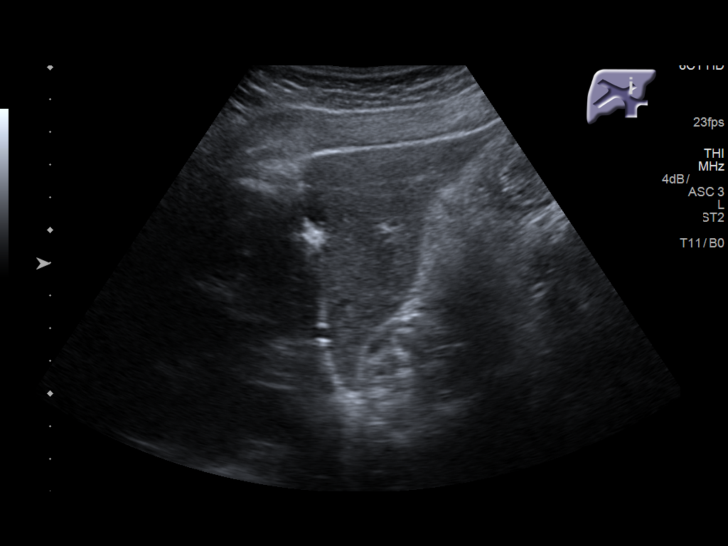
[im 22/59]
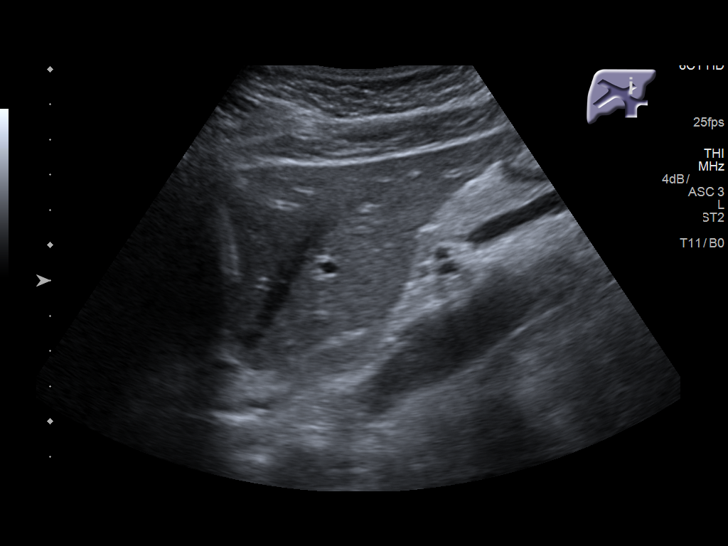
[im 27/59]
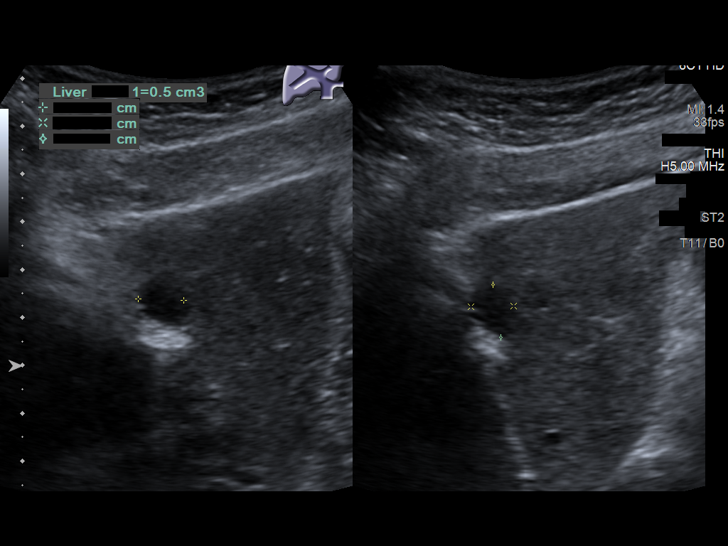
[im 32/59]
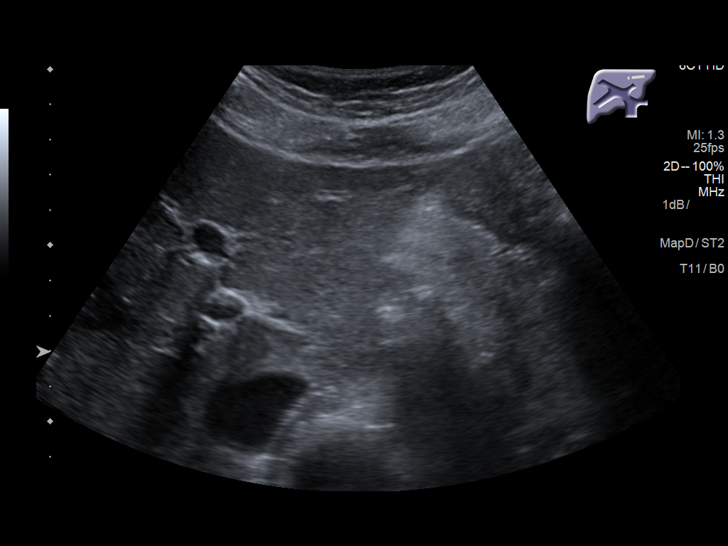
[im 37/59]
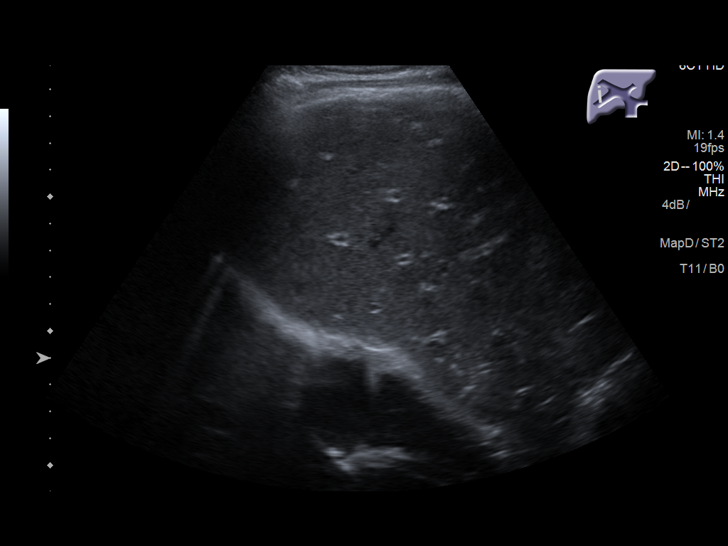
[im 39/59]
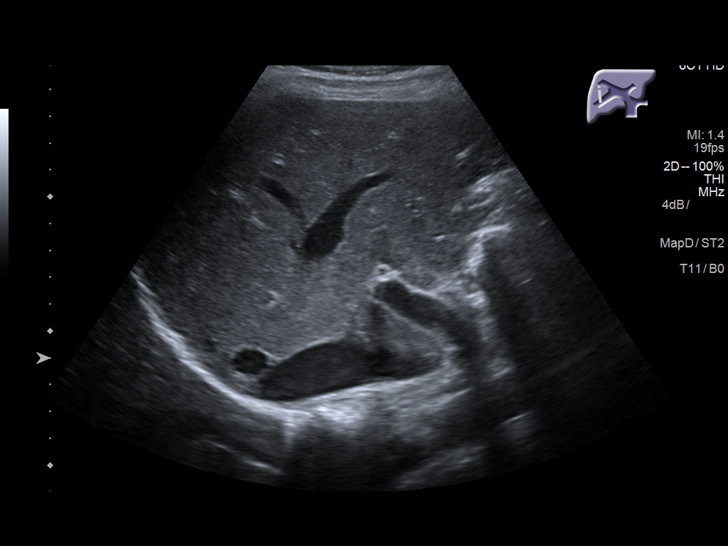
[im 44/59]
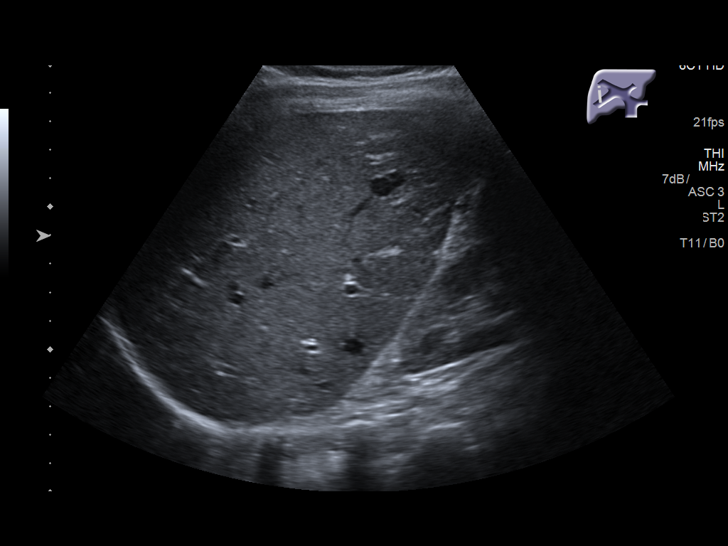
[im 49/59]
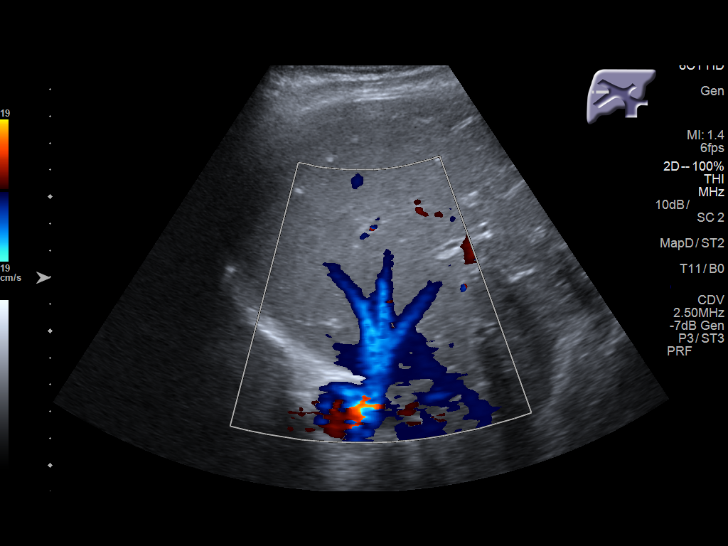
[im 54/59]
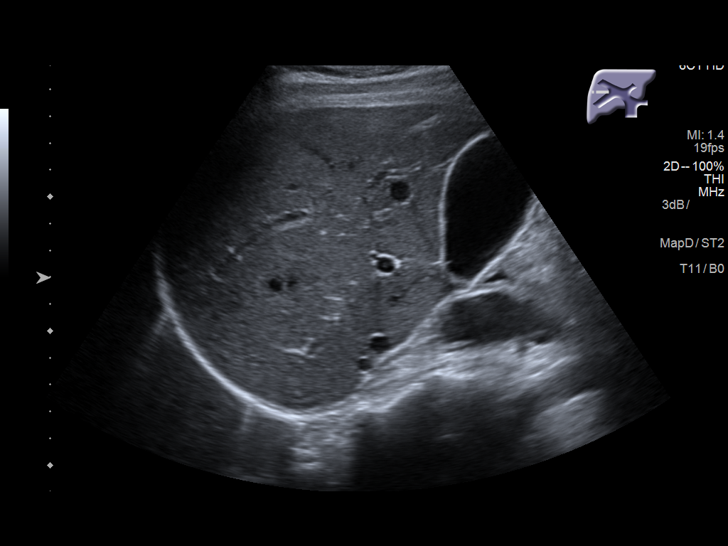
[im 59/59]
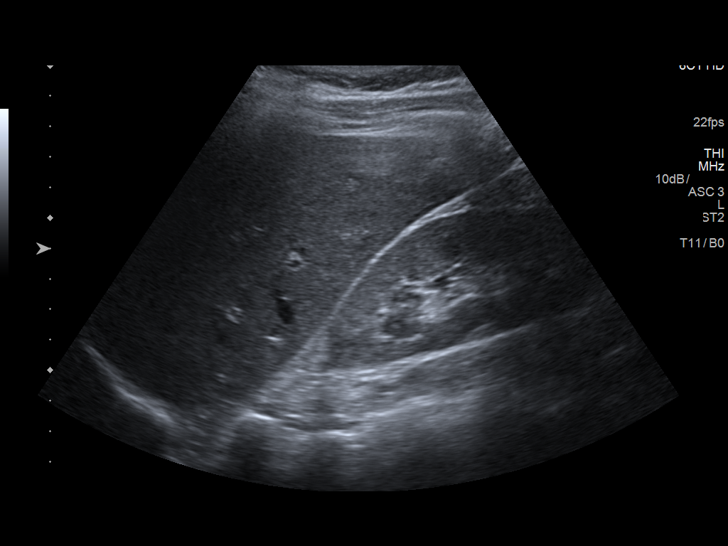

[14 of 25 positions shown; findings below may reference images not displayed]

FINDINGS: Gallbladder:

Tiny nonobstructing echogenic calcification within the gallbladder.
No gallbladder wall thickening or pericholecystic fluid. No
sonographic Murphy sign was elicited by the technologist.

Common bile duct:

Diameter: 2 mm in normal

Liver:

Left hepatic hypoechoic lesion with internal echoes measuring
approximately 1 cm in diameter but without demonstrable vascularity
likely to represent a complex cyst. Within normal limits in
parenchymal echogenicity. Portal vein is patent on color Doppler
imaging with normal direction of blood flow towards the liver.
Portal vein is patent with normal directional flow to ports the
liver.
IMPRESSION: 1. Punctate nonobstructing calculus is noted in the gallbladder
without secondary signs of acute cholecystitis.
2. Hypoechoic, avascular 1 cm lesion in the left hepatic lobe with
internal debris situated near the dome consistent with a complex
cyst.

## 2018-04-26 DIAGNOSIS — M25562 Pain in left knee: Secondary | ICD-10-CM | POA: Diagnosis not present

## 2018-04-26 DIAGNOSIS — M25561 Pain in right knee: Secondary | ICD-10-CM | POA: Diagnosis not present

## 2018-05-11 DIAGNOSIS — R600 Localized edema: Secondary | ICD-10-CM | POA: Diagnosis not present

## 2018-05-11 DIAGNOSIS — M222X2 Patellofemoral disorders, left knee: Secondary | ICD-10-CM | POA: Diagnosis not present

## 2018-05-11 DIAGNOSIS — M222X1 Patellofemoral disorders, right knee: Secondary | ICD-10-CM | POA: Diagnosis not present

## 2018-05-16 DIAGNOSIS — O0911 Supervision of pregnancy with history of ectopic or molar pregnancy, first trimester: Secondary | ICD-10-CM | POA: Diagnosis not present

## 2018-05-16 DIAGNOSIS — Z3A01 Less than 8 weeks gestation of pregnancy: Secondary | ICD-10-CM | POA: Diagnosis not present

## 2018-05-16 DIAGNOSIS — Z3201 Encounter for pregnancy test, result positive: Secondary | ICD-10-CM | POA: Diagnosis not present

## 2018-05-23 DIAGNOSIS — O209 Hemorrhage in early pregnancy, unspecified: Secondary | ICD-10-CM | POA: Diagnosis not present

## 2018-05-23 DIAGNOSIS — Z3A01 Less than 8 weeks gestation of pregnancy: Secondary | ICD-10-CM | POA: Diagnosis not present

## 2018-06-10 DIAGNOSIS — Z3201 Encounter for pregnancy test, result positive: Secondary | ICD-10-CM | POA: Diagnosis not present

## 2018-06-20 DIAGNOSIS — Z8639 Personal history of other endocrine, nutritional and metabolic disease: Secondary | ICD-10-CM | POA: Diagnosis not present

## 2018-06-20 DIAGNOSIS — O09521 Supervision of elderly multigravida, first trimester: Secondary | ICD-10-CM | POA: Diagnosis not present

## 2018-06-20 DIAGNOSIS — Z3A10 10 weeks gestation of pregnancy: Secondary | ICD-10-CM | POA: Diagnosis not present

## 2018-07-11 DIAGNOSIS — Z3682 Encounter for antenatal screening for nuchal translucency: Secondary | ICD-10-CM | POA: Diagnosis not present

## 2018-07-11 DIAGNOSIS — Z3482 Encounter for supervision of other normal pregnancy, second trimester: Secondary | ICD-10-CM | POA: Diagnosis not present

## 2018-08-06 DIAGNOSIS — R51 Headache: Secondary | ICD-10-CM | POA: Insufficient documentation

## 2018-08-06 DIAGNOSIS — O9989 Other specified diseases and conditions complicating pregnancy, childbirth and the puerperium: Secondary | ICD-10-CM | POA: Diagnosis not present

## 2018-08-06 DIAGNOSIS — H53149 Visual discomfort, unspecified: Secondary | ICD-10-CM | POA: Diagnosis not present

## 2018-08-06 DIAGNOSIS — O219 Vomiting of pregnancy, unspecified: Secondary | ICD-10-CM | POA: Insufficient documentation

## 2018-08-06 DIAGNOSIS — Z3A17 17 weeks gestation of pregnancy: Secondary | ICD-10-CM | POA: Insufficient documentation

## 2018-08-06 DIAGNOSIS — Z79899 Other long term (current) drug therapy: Secondary | ICD-10-CM | POA: Diagnosis not present

## 2018-08-06 LAB — COMPREHENSIVE METABOLIC PANEL
ALK PHOS: 47 U/L (ref 38–126)
ALT: 16 U/L (ref 0–44)
AST: 18 U/L (ref 15–41)
Albumin: 3.7 g/dL (ref 3.5–5.0)
Anion gap: 10 (ref 5–15)
BILIRUBIN TOTAL: 1 mg/dL (ref 0.3–1.2)
BUN: 14 mg/dL (ref 6–20)
CALCIUM: 9 mg/dL (ref 8.9–10.3)
CO2: 22 mmol/L (ref 22–32)
CREATININE: 0.64 mg/dL (ref 0.44–1.00)
Chloride: 105 mmol/L (ref 98–111)
Glucose, Bld: 105 mg/dL — ABNORMAL HIGH (ref 70–99)
Potassium: 4.2 mmol/L (ref 3.5–5.1)
Sodium: 137 mmol/L (ref 135–145)
Total Protein: 7.3 g/dL (ref 6.5–8.1)

## 2018-08-06 LAB — CBC WITH DIFFERENTIAL/PLATELET
Basophils Absolute: 0 10*3/uL (ref 0–0.1)
Basophils Relative: 0 %
Eosinophils Absolute: 0 10*3/uL (ref 0–0.7)
Eosinophils Relative: 0 %
HCT: 38.5 % (ref 35.0–47.0)
Hemoglobin: 13.3 g/dL (ref 12.0–16.0)
LYMPHS ABS: 1 10*3/uL (ref 1.0–3.6)
LYMPHS PCT: 11 %
MCH: 30.1 pg (ref 26.0–34.0)
MCHC: 34.6 g/dL (ref 32.0–36.0)
MCV: 86.9 fL (ref 80.0–100.0)
Monocytes Absolute: 0.4 10*3/uL (ref 0.2–0.9)
Monocytes Relative: 4 %
NEUTROS PCT: 85 %
Neutro Abs: 7.4 10*3/uL — ABNORMAL HIGH (ref 1.4–6.5)
Platelets: 218 10*3/uL (ref 150–440)
RBC: 4.43 MIL/uL (ref 3.80–5.20)
RDW: 12.6 % (ref 11.5–14.5)
WBC: 8.7 10*3/uL (ref 3.6–11.0)

## 2018-08-06 MED ORDER — METOCLOPRAMIDE HCL 5 MG/ML IJ SOLN
10.0000 mg | Freq: Once | INTRAMUSCULAR | Status: AC
  Administered 2018-08-06: 10 mg via INTRAVENOUS
  Filled 2018-08-06: qty 2

## 2018-08-06 MED ORDER — DIPHENHYDRAMINE HCL 50 MG/ML IJ SOLN
50.0000 mg | Freq: Once | INTRAMUSCULAR | Status: AC
  Administered 2018-08-06: 50 mg via INTRAVENOUS
  Filled 2018-08-06: qty 1

## 2018-08-06 MED ORDER — SODIUM CHLORIDE 0.9 % IV BOLUS
1000.0000 mL | Freq: Once | INTRAVENOUS | Status: AC
  Administered 2018-08-06: 1000 mL via INTRAVENOUS

## 2018-08-06 NOTE — ED Triage Notes (Signed)
Patient c/o headache, N/V, light sensitivity beginning today. Patient reports 10+ emeses today. Patient reports hx of migraines.

## 2018-08-07 ENCOUNTER — Emergency Department
Admission: EM | Admit: 2018-08-07 | Discharge: 2018-08-07 | Disposition: A | Discharge status: Home / Self Care | Payer: BLUE CROSS/BLUE SHIELD | Attending: Emergency Medicine | Admitting: Emergency Medicine

## 2018-08-07 DIAGNOSIS — R51 Headache: Secondary | ICD-10-CM

## 2018-08-07 DIAGNOSIS — R519 Headache, unspecified: Secondary | ICD-10-CM

## 2018-08-07 DIAGNOSIS — R112 Nausea with vomiting, unspecified: Secondary | ICD-10-CM

## 2018-08-07 NOTE — ED Provider Notes (Signed)
[facility] Emergency Department Provider Note  ____________________________________________   First MD Initiated Contact with Patient 08/07/18 0022     (approximate)  I have reviewed the triage vital signs and the nursing notes.   HISTORY  Chief Complaint Emesis and Headache    HPI [name] Ralph Leyden is a 37 y.o. female who is about [redacted] weeks pregnant and has a history of migraines and presents by private vehicle for evaluation of gradually worsening headache, nausea, vomiting, and light sensitivity starting earlier today.  She states the pain was primarily on the left side of her head, severe, throbbing and aching.  She believes it started as a tension headache and developed into a migraine.  She cannot take her usual ibuprofen because of her pregnancy.  Nothing particular made the symptoms better and lights and loud noises made it worse.  She believes that she has had at least 10 episodes of emesis today.  She denies fever/chills, neck pain, neck stiffness, chest pain, shortness of breath, abdominal pain, vaginal bleeding, and dysuria.  Past Medical History:  Diagnosis Date  . Hashimoto's disease   . Hashimoto's disease   . Migraines   . PONV (postoperative nausea and vomiting)   . Seasonal allergies     Patient Active Problem List   Diagnosis Date Noted  . Postpartum care following vaginal delivery 4/3 02/02/2017  . Obstetrical laceration 2nd deg vaginal 02/02/2017  . Ectopic pregnancy of ovary 02/13/2016  . Wellness examination 11/06/2015  . Hyperglycemia 11/06/2015  . Abnormal thyroid function test 05/01/2015  . Acute mastitis of right breast 12/20/2014  . Acute mastitis of left breast 11/09/2014  . Constipation 11/07/2014  . Migraine with aura 06/04/2014  . Derangement of lateral meniscus 12/21/2011  . Arthralgia of lower leg 11/20/2011  . Allergic rhinitis 10/13/2010    Past Surgical History:  Procedure Laterality Date  . KNEE  ARTHROSCOPY Right 12/2011   Microfracture  . KNEE ARTHROSCOPY Left 08/2008  . LAPAROSCOPIC UNILATERAL SALPINGECTOMY Right 02/13/2016   Procedure: LAPAROSCOPIC right salpingooophorectomy;  Surgeon: Suzy Bouchard, MD;  Location: [facility] ORS;  Service: Gynecology;  Laterality: Right;  . LAPAROSCOPY N/A 02/13/2016   Procedure: LAPAROSCOPY DIAGNOSTIC;  Surgeon: Suzy Bouchard, MD;  Location: [facility] ORS;  Service: Gynecology;  Laterality: N/A;  . TONSILLECTOMY AND ADENOIDECTOMY      Prior to Admission medications   Medication Sig Start Date End Date Taking? Authorizing Provider  acetaminophen (TYLENOL) 325 MG tablet Take 2 tablets (650 mg total) by mouth every 4 (four) hours as needed (for pain scale < 4). 02/03/17   Shea Evans, MD  benzocaine-Menthol (DERMOPLAST) 20-0.5 % AERO Apply 1 application topically as needed for irritation (perineal discomfort). Patient not taking: Reported on 07/30/2017 02/03/17   Shea Evans, MD  butalbital-acetaminophen-caffeine (FIORICET, ESGIC) [phone] MG tablet Take 1 tablet by mouth every 4 (four) hours as needed for headache or migraine (per CNM Q4-6 hrs prn). 02/03/17   Shea Evans, MD  Butalbital-APAP-Caffeine [phone] MG per capsule Take 1-2 capsules by mouth every 6 (six) hours as needed for headache. Patient not taking: Reported on 07/30/2017 05/07/14   Tereso Newcomer, MD  ibuprofen (ADVIL,MOTRIN) 600 MG tablet Take 1 tablet (600 mg total) by mouth every 6 (six) hours. Patient not taking: Reported on 07/30/2017 02/03/17   Shea Evans, MD  ondansetron (ZOFRAN ODT) 4 MG disintegrating tablet Take 1 tablet (4 mg total) by mouth every 8 (eight) hours as needed for nausea or vomiting. 08/01/17  Minna Antis, MD  Prenatal Vit-Fe Fumarate-FA (PRENATAL MULTIVITAMIN) TABS tablet Take 1 tablet by mouth daily at 12 noon.    [provider]    Allergies Cephalosporins; Fentanyl; Gluten meal; Oxycodone; and Penicillins  Family History    Problem Relation Age of Onset  . Cancer Mother        stomach  . Hypertension Father   . Cancer Maternal Grandfather        brain  . Cancer Paternal Grandmother        breast  . Heart disease Paternal Grandfather   . Diabetes Mellitus I Sister   . Thyroid disease Neg Hx     Social History Social History   Tobacco Use  . Smoking status: Never Smoker  . Smokeless tobacco: Never Used  Substance Use Topics  . Alcohol use: No    Alcohol/week: 0.0 standard drinks  . Drug use: No    Review of Systems Constitutional: No fever/chills Eyes: No visual changes. ENT: No sore throat. Cardiovascular: Denies chest pain. Respiratory: Denies shortness of breath. Gastrointestinal: No abdominal pain.  Multiple episodes of nausea and vomiting.  No diarrhea.  No constipation. Genitourinary: Negative for dysuria. Musculoskeletal: Negative for neck pain.  Negative for back pain. Integumentary: Negative for rash. Neurological: Headache as described above.  No focal numbness nor weakness   ____________________________________________   PHYSICAL EXAM:  VITAL SIGNS: ED Triage Vitals [08/06/18 2034]  Enc Vitals Group     BP 106/74     Pulse Rate (!) 116     Resp 18     Temp 98.5 F (36.9 C)     Temp Source Oral     SpO2 99 %     Weight 65.8 kg (145 lb)     Height 1.676 m (5\' 6" )     Head Circumference      Peak Flow      Pain Score 7     Pain Loc      Pain Edu?      Excl. in GC?     Constitutional: Alert and oriented. Well appearing and in no acute distress. Eyes: Conjunctivae are normal. PERRL. EOMI. Head: Atraumatic. Nose: No congestion/rhinnorhea. Mouth/Throat: Mucous membranes are moist. Neck: No stridor.  No meningeal signs.   Cardiovascular: Normal rate, regular rhythm. Good peripheral circulation. Grossly normal heart sounds. Respiratory: Normal respiratory effort.  No retractions. Lungs CTAB. Gastrointestinal: Soft and nontender. No distention.  Musculoskeletal:  No lower extremity tenderness nor edema. No gross deformities of extremities. Neurologic:  Normal speech and language. No gross focal neurologic deficits are appreciated.  Skin:  Skin is warm, dry and intact. No rash noted. Psychiatric: Mood and affect are normal. Speech and behavior are normal.  ____________________________________________   LABS (all labs ordered are listed, but only abnormal results are displayed)  Labs Reviewed  CBC WITH DIFFERENTIAL/PLATELET - Abnormal; Notable for the following components:      Result Value   Neutro Abs 7.4 (*)    All other components within normal limits  COMPREHENSIVE METABOLIC PANEL - Abnormal; Notable for the following components:   Glucose, Bld 105 (*)    All other components within normal limits  URINALYSIS, COMPLETE (UACMP) WITH MICROSCOPIC   ____________________________________________  EKG  None - EKG not ordered by ED physician ____________________________________________  RADIOLOGY   ED MD interpretation: No indication for imaging  Official radiology report(s): No results found.  ____________________________________________   PROCEDURES  Critical Care performed: No   Procedure(s) performed:  Procedures   ____________________________________________   INITIAL IMPRESSION / ASSESSMENT AND PLAN / ED COURSE  As part of my medical decision making, I reviewed the following data within the electronic MEDICAL RECORD NUMBER History obtained from family, Nursing notes reviewed and incorporated, Labs reviewed  and Notes from prior ED visits    Differential diagnosis includes, but is not limited to, migraine headache, tension headache, other nonspecific headache, intracranial bleeding such as a subarachnoid from an aneurysm.  The patient is very well-appearing at this time.  Initially she had some tachycardia but that has resolved.  She was treated with Reglan and Benadryl while she was awaiting a room and she says that her  headache is gone, or at least sufficiently improved that she says she is ready to go home.  I offered additional treatment such as magnesium and her Decadron but she says she feels fine.  There is no indication of any infectious process nor neurological deficit.  Lab work is all within normal limits including her electrolytes and renal function.  No leukocytosis.  She will follow-up with her OB/GYN as scheduled in less than 48 hours.  Insert gave precautions     ____________________________________________  FINAL CLINICAL IMPRESSION(S) / ED DIAGNOSES  Final diagnoses:  Acute nonintractable headache, unspecified headache type  Non-intractable vomiting with nausea, unspecified vomiting type     MEDICATIONS GIVEN DURING THIS VISIT:  Medications  metoCLOPramide (REGLAN) injection 10 mg (10 mg Intravenous Given 08/06/18 2053)  diphenhydrAMINE (BENADRYL) injection 50 mg (50 mg Intravenous Given 08/06/18 2055)  sodium chloride 0.9 % bolus 1,000 mL (1,000 mLs Intravenous New Bag/Given 08/06/18 2051)     ED Discharge Orders    None       Note:  This document was prepared using Dragon voice recognition software and may include unintentional dictation errors.    Loleta Rose, MD 08/07/18 [phone]

## 2018-08-07 NOTE — Discharge Instructions (Addendum)
You have been seen in the Emergency Department (ED) for a headache.  Please use Tylenol as needed for symptoms, but only as written on the box.  As we have discussed, please follow up with your primary care doctor as soon as possible regarding today's Emergency Department (ED) visit and your headache symptoms.    Call your doctor or return to the ED if you have a worsening headache, sudden and severe headache, confusion, slurred speech, facial droop, weakness or numbness in any arm or leg, extreme fatigue, vision problems, or other symptoms that concern you.  

## 2018-08-08 DIAGNOSIS — Z361 Encounter for antenatal screening for raised alphafetoprotein level: Secondary | ICD-10-CM | POA: Diagnosis not present

## 2018-08-08 DIAGNOSIS — O09522 Supervision of elderly multigravida, second trimester: Secondary | ICD-10-CM | POA: Diagnosis not present

## 2018-08-08 DIAGNOSIS — Z3A17 17 weeks gestation of pregnancy: Secondary | ICD-10-CM | POA: Diagnosis not present

## 2018-08-24 DIAGNOSIS — O09519 Supervision of elderly primigravida, unspecified trimester: Secondary | ICD-10-CM | POA: Diagnosis not present

## 2018-08-24 DIAGNOSIS — Z23 Encounter for immunization: Secondary | ICD-10-CM | POA: Diagnosis not present

## 2018-08-24 DIAGNOSIS — O09522 Supervision of elderly multigravida, second trimester: Secondary | ICD-10-CM | POA: Diagnosis not present

## 2018-08-24 DIAGNOSIS — Z3A19 19 weeks gestation of pregnancy: Secondary | ICD-10-CM | POA: Diagnosis not present

## 2018-09-21 DIAGNOSIS — Z8639 Personal history of other endocrine, nutritional and metabolic disease: Secondary | ICD-10-CM | POA: Diagnosis not present

## 2018-09-21 DIAGNOSIS — Z3482 Encounter for supervision of other normal pregnancy, second trimester: Secondary | ICD-10-CM | POA: Diagnosis not present

## 2018-09-21 DIAGNOSIS — Z3689 Encounter for other specified antenatal screening: Secondary | ICD-10-CM | POA: Diagnosis not present

## 2018-10-31 DIAGNOSIS — Z3A29 29 weeks gestation of pregnancy: Secondary | ICD-10-CM | POA: Diagnosis not present

## 2018-10-31 DIAGNOSIS — Z8639 Personal history of other endocrine, nutritional and metabolic disease: Secondary | ICD-10-CM | POA: Diagnosis not present

## 2018-10-31 DIAGNOSIS — O09523 Supervision of elderly multigravida, third trimester: Secondary | ICD-10-CM | POA: Diagnosis not present

## 2018-10-31 DIAGNOSIS — Z3689 Encounter for other specified antenatal screening: Secondary | ICD-10-CM | POA: Diagnosis not present

## 2018-11-02 NOTE — L&D Delivery Note (Signed)
Delivery Note  First Stage: Labor onset: 01/17/2019 @ 0100 Augmentation : none Analgesia /Anesthesia intrapartum: hydrotherapy SROM at 0923 - clear  Second Stage: Complete dilation at 0910 Involuntary pushing at 8:58am FHR second stage - intermittent FHR 130s-150 bpm before during and after contractions; During crowning +5 station - FHR was 80 bpm briefly, but delivered fetal head <30 seconds later.  Delivery of a healthy, viable female "Judee Clara" under water in water birth tub at [phone] by Carlean Jews, CNM in LOA position. Shoulder and body easily delivered under water and baby immediately brought up out of water and brought to mom's chest. No nuchal cord. Short umbilical cord noted. Cord double clamped after cessation of pulsation, cut by [facility] Cord blood sample collected   Third Stage: Placenta delivered via Tomasa Blase intact with 3 VC @ [phone] Placenta disposition: hospital disposal  Uterine tone initially boggy, but firmed with massage / bleeding - slowed with massage, pt. Declined IM Pitocin  1st degree laceration and periurethral identified. Pt. Did not tolerate exam well despite local. Pt. Declined nitrous oxide or IV pain medication. 800mg  Ibuprofen given x 1. 1st degree repaired with 3.0 vicryl; ecchymosis and edema noted, but no evidence of a hematoma. Periurethral hemostatic and not repaired due to patient's discomfort Anesthesia for repair: 1% Lidocaine 69mL Est. Blood Loss (mL):  Complications: precipitous delivery   Mom to postpartum.  Baby to Couplet care / Skin to Skin.  Newborn: Birth Weight: 7#6.3oz (3355g) Apgar Scores: 9, 9  Feeding planned: breast  Carlean Jews, MSN, CNM [facility]

## 2018-11-16 DIAGNOSIS — Z3A31 31 weeks gestation of pregnancy: Secondary | ICD-10-CM | POA: Diagnosis not present

## 2018-11-16 DIAGNOSIS — O09523 Supervision of elderly multigravida, third trimester: Secondary | ICD-10-CM | POA: Diagnosis not present

## 2018-12-07 DIAGNOSIS — Z3A34 34 weeks gestation of pregnancy: Secondary | ICD-10-CM | POA: Diagnosis not present

## 2018-12-07 DIAGNOSIS — O09523 Supervision of elderly multigravida, third trimester: Secondary | ICD-10-CM | POA: Diagnosis not present

## 2018-12-13 DIAGNOSIS — J0140 Acute pansinusitis, unspecified: Secondary | ICD-10-CM | POA: Diagnosis not present

## 2018-12-13 DIAGNOSIS — E038 Other specified hypothyroidism: Secondary | ICD-10-CM | POA: Diagnosis not present

## 2018-12-13 DIAGNOSIS — E063 Autoimmune thyroiditis: Secondary | ICD-10-CM | POA: Diagnosis not present

## 2018-12-21 DIAGNOSIS — Z3483 Encounter for supervision of other normal pregnancy, third trimester: Secondary | ICD-10-CM | POA: Diagnosis not present

## 2018-12-21 DIAGNOSIS — Z23 Encounter for immunization: Secondary | ICD-10-CM | POA: Diagnosis not present

## 2018-12-30 DIAGNOSIS — O09523 Supervision of elderly multigravida, third trimester: Secondary | ICD-10-CM | POA: Diagnosis not present

## 2018-12-30 DIAGNOSIS — Z3A38 38 weeks gestation of pregnancy: Secondary | ICD-10-CM | POA: Diagnosis not present

## 2019-01-04 DIAGNOSIS — Z3A38 38 weeks gestation of pregnancy: Secondary | ICD-10-CM | POA: Diagnosis not present

## 2019-01-04 DIAGNOSIS — O09523 Supervision of elderly multigravida, third trimester: Secondary | ICD-10-CM | POA: Diagnosis not present

## 2019-01-13 DIAGNOSIS — O09523 Supervision of elderly multigravida, third trimester: Secondary | ICD-10-CM | POA: Diagnosis not present

## 2019-01-13 DIAGNOSIS — Z3A40 40 weeks gestation of pregnancy: Secondary | ICD-10-CM | POA: Diagnosis not present

## 2019-01-16 ENCOUNTER — Other Ambulatory Visit: Payer: Self-pay

## 2019-01-16 ENCOUNTER — Inpatient Hospital Stay (EMERGENCY_DEPARTMENT_HOSPITAL)
Admission: AD | Admit: 2019-01-16 | Discharge: 2019-01-16 | Disposition: A | Discharge status: Home / Self Care | Payer: BLUE CROSS/BLUE SHIELD | Source: Home / Self Care | Attending: Obstetrics | Admitting: Obstetrics

## 2019-01-16 ENCOUNTER — Encounter (HOSPITAL_COMMUNITY): Payer: Self-pay | Admitting: *Deleted

## 2019-01-16 DIAGNOSIS — O368130 Decreased fetal movements, third trimester, not applicable or unspecified: Secondary | ICD-10-CM | POA: Diagnosis not present

## 2019-01-16 DIAGNOSIS — Z3490 Encounter for supervision of normal pregnancy, unspecified, unspecified trimester: Secondary | ICD-10-CM

## 2019-01-16 DIAGNOSIS — Z3A40 40 weeks gestation of pregnancy: Secondary | ICD-10-CM

## 2019-01-16 DIAGNOSIS — Z880 Allergy status to penicillin: Secondary | ICD-10-CM

## 2019-01-16 DIAGNOSIS — Z2882 Immunization not carried out because of caregiver refusal: Secondary | ICD-10-CM | POA: Diagnosis not present

## 2019-01-16 DIAGNOSIS — O471 False labor at or after 37 completed weeks of gestation: Secondary | ICD-10-CM | POA: Insufficient documentation

## 2019-01-16 DIAGNOSIS — O26893 Other specified pregnancy related conditions, third trimester: Secondary | ICD-10-CM | POA: Diagnosis not present

## 2019-01-16 DIAGNOSIS — O09523 Supervision of elderly multigravida, third trimester: Secondary | ICD-10-CM | POA: Diagnosis not present

## 2019-01-16 NOTE — MAU Provider Note (Signed)
History     CSN: [id number]  Arrival date and time: 01/16/19 1723   First Provider Initiated Contact with Patient 01/16/19 1828      Chief Complaint  Patient presents with  . Labor Eval   HPI  [name] is a 38 y.o. G3P2002 at [redacted]w[redacted]d sent to MAU from office for prolonged monitoring. Seen in office today for painful ctx and decreased FM. BPP 6/8 (no FBM) and NST non-reactive, variable decelerations noted.  Patient reports ctx since 1320 today, has a hx of rapid labor with her last baby. No LOF or VB, no HA/NV/RUQ pain.  Prenatal course uncomplicated to date, hx Hashimoto's with stable TFT's each trimester. Desires waterbirth, low intervention labor.   Patient seen by MAU provider on arrival despite phone call from private provider to inform unit patient was being sent over and to be followed by private provider.   OB History    Gravida  3   Para  2   Term  2   Preterm      AB      Living  2     SAB      TAB      Ectopic      Multiple  0   Live Births  2           Past Medical History:  Diagnosis Date  . Hashimoto's disease   . Hashimoto's disease   . Migraines   . PONV (postoperative nausea and vomiting)   . Seasonal allergies     Past Surgical History:  Procedure Laterality Date  . KNEE ARTHROSCOPY Right 12/2011   Microfracture  . KNEE ARTHROSCOPY Left 08/2008  . LAPAROSCOPIC UNILATERAL SALPINGECTOMY Right 02/13/2016   Procedure: LAPAROSCOPIC right salpingooophorectomy;  Surgeon: Suzy Bouchard, MD;  Location: [facility] ORS;  Service: Gynecology;  Laterality: Right;  . LAPAROSCOPY N/A 02/13/2016   Procedure: LAPAROSCOPY DIAGNOSTIC;  Surgeon: Suzy Bouchard, MD;  Location: [facility] ORS;  Service: Gynecology;  Laterality: N/A;  . TONSILLECTOMY AND ADENOIDECTOMY      Family History  Problem Relation Age of Onset  . Cancer Mother        stomach  . Hypertension Father   . Cancer Maternal Grandfather        brain  . Cancer Paternal  Grandmother        breast  . Heart disease Paternal Grandfather   . Diabetes Mellitus I Sister   . Thyroid disease Neg Hx     Social History   Tobacco Use  . Smoking status: Never Smoker  . Smokeless tobacco: Never Used  Substance Use Topics  . Alcohol use: No    Alcohol/week: 0.0 standard drinks  . Drug use: No    Allergies:  Allergies  Allergen Reactions  . Cephalosporins Nausea And Vomiting    Pt has tolerated IV ampicillin  . Fentanyl Nausea And Vomiting  . Gluten Meal Other (See Comments)  . Oxycodone Nausea And Vomiting and Other (See Comments)    Nausea vomiting and severe light headedness.  Marland Kitchen Penicillins Rash    Has patient had a PCN reaction causing immediate rash, facial/tongue/throat swelling, SOB or lightheadedness with hypotension: No Has patient had a PCN reaction causing severe rash involving mucus membranes or skin necrosis: No Has patient had a PCN reaction that required hospitalization: No Has patient had a PCN reaction occurring within the last 10 years: No If all of the above answers are "NO", then may proceed with Cephalosporin  use  Pt states she can tolerate ampicillin      Medications Prior to Admission  Medication Sig Dispense Refill Last Dose  . acetaminophen (TYLENOL) 325 MG tablet Take 2 tablets (650 mg total) by mouth every 4 (four) hours as needed (for pain scale < 4). 30 tablet 0 Taking  . benzocaine-Menthol (DERMOPLAST) 20-0.5 % AERO Apply 1 application topically as needed for irritation (perineal discomfort). (Patient not taking: Reported on 07/30/2017) 1 each 1 Not Taking  . butalbital-acetaminophen-caffeine (FIORICET, ESGIC) 50-325-40 MG tablet Take 1 tablet by mouth every 4 (four) hours as needed for headache or migraine (per CNM Q4-6 hrs prn). 14 tablet 0 Taking  . Butalbital-APAP-Caffeine 50-325-40 MG per capsule Take 1-2 capsules by mouth every 6 (six) hours as needed for headache. (Patient not taking: Reported on 07/30/2017) 30 capsule  3 Not Taking  . ibuprofen (ADVIL,MOTRIN) 600 MG tablet Take 1 tablet (600 mg total) by mouth every 6 (six) hours. (Patient not taking: Reported on 07/30/2017) 30 tablet 0 Not Taking  . ondansetron (ZOFRAN ODT) 4 MG disintegrating tablet Take 1 tablet (4 mg total) by mouth every 8 (eight) hours as needed for nausea or vomiting. 20 tablet 0 Taking  . Prenatal Vit-Fe Fumarate-FA (PRENATAL MULTIVITAMIN) TABS tablet Take 1 tablet by mouth daily at 12 noon.   Taking    Review of Systems Physical Exam   Blood pressure 117/65, pulse (!) 106, temperature 97.9 F (36.6 C), resp. rate 16, height 5\' 6"  (1.676 m), weight 78 kg, SpO2 100 %, currently breastfeeding.  Physical Exam Gen: AAO x 3, NAD, breathing through some contractions, others not perceived FHR 130 bpm, mod var, + accels 15x15, no decels Ctx q 710 min, mild to plap Dilation: 1 Effacement (%): 60 Cervical Position: Posterior Station: -3 Presentation: Vertex Exam by:: Carloyn Jaeger, CNM  MAU Course  Procedures  EFM prolonged x 1 hr and 20 min Reactive NST Assessment and Plan  G3P2002 at [redacted]w[redacted]d Latent labor FHT reassuring, category 1 GBS screen declined  Patient recommended IOL given hx rapid birth with previous pregnancy and remote residence (lives 30 min away).  Patient declines, wants to await physiologic labor. Will go into town and get dinner and return if contractions increase in frequency and intensity or if ROM occurs.  If no active labor, to follow up with scheduled visit on Friday [facility] instructed daily   Neta Mends 01/16/2019, 7:20 PM

## 2019-01-16 NOTE — MAU Provider Note (Signed)
History     CSN: [id number]  Arrival date and time: 01/16/19 1723   First Provider Initiated Contact with Patient 01/16/19 1828      Chief Complaint  Patient presents with  . Labor Eval   HPI  Ms.  Dareth Krystyna Littman is a 38 y.o. year old G82P2002 female at [redacted]w[redacted]d weeks gestation who was sent to MAU from [facility]. She reports she had DFM this morning, tried to eat breakfast, drink cold water, and lie down with still no FM. She called [facility] and was seen in the office today at 1400. She had a NST and an U/S. She does not remember what Dr. Billy Coast said was the result of her BPP today (doesn't know results), but she does remember him saying "it wasn't quite reactive enough and needs more monitoring at [facility]." She also states that she started having some UC's on her way to [facility], started having UC's every 6 mins while there, some relief on the way here, but now they are picking up in frequency."  Past Medical History:  Diagnosis Date  . Hashimoto's disease   . Hashimoto's disease   . Migraines   . PONV (postoperative nausea and vomiting)   . Seasonal allergies     Past Surgical History:  Procedure Laterality Date  . KNEE ARTHROSCOPY Right 12/2011   Microfracture  . KNEE ARTHROSCOPY Left 08/2008  . LAPAROSCOPIC UNILATERAL SALPINGECTOMY Right 02/13/2016   Procedure: LAPAROSCOPIC right salpingooophorectomy;  Surgeon: Suzy Bouchard, MD;  Location: [facility] ORS;  Service: Gynecology;  Laterality: Right;  . LAPAROSCOPY N/A 02/13/2016   Procedure: LAPAROSCOPY DIAGNOSTIC;  Surgeon: Suzy Bouchard, MD;  Location: [facility] ORS;  Service: Gynecology;  Laterality: N/A;  . TONSILLECTOMY AND ADENOIDECTOMY      Family History  Problem Relation Age of Onset  . Cancer Mother        stomach  . Hypertension Father   . Cancer Maternal Grandfather        brain  . Cancer Paternal Grandmother        breast  . Heart disease Paternal Grandfather   . Diabetes Mellitus I Sister   . Thyroid disease Neg Hx      Social History   Tobacco Use  . Smoking status: Never Smoker  . Smokeless tobacco: Never Used  Substance Use Topics  . Alcohol use: No    Alcohol/week: 0.0 standard drinks  . Drug use: No    Allergies:  Allergies  Allergen Reactions  . Cephalosporins Nausea And Vomiting    Pt has tolerated IV ampicillin  . Fentanyl Nausea And Vomiting  . Gluten Meal Other (See Comments)  . Oxycodone Nausea And Vomiting and Other (See Comments)    Nausea vomiting and severe light headedness.  Marland Kitchen Penicillins Rash    Has patient had a PCN reaction causing immediate rash, facial/tongue/throat swelling, SOB or lightheadedness with hypotension: No Has patient had a PCN reaction causing severe rash involving mucus membranes or skin necrosis: No Has patient had a PCN reaction that required hospitalization: No Has patient had a PCN reaction occurring within the last 10 years: No If all of the above answers are "NO", then may proceed with Cephalosporin use  Pt states she can tolerate ampicillin      Medications Prior to Admission  Medication Sig Dispense Refill Last Dose  . acetaminophen (TYLENOL) 325 MG tablet Take 2 tablets (650 mg total) by mouth every 4 (four) hours as needed (for pain scale < 4). 30 tablet  0 Taking  . benzocaine-Menthol (DERMOPLAST) 20-0.5 % AERO Apply 1 application topically as needed for irritation (perineal discomfort). (Patient not taking: Reported on 07/30/2017) 1 each 1 Not Taking  . butalbital-acetaminophen-caffeine (FIORICET, ESGIC) 50-325-40 MG tablet Take 1 tablet by mouth every 4 (four) hours as needed for headache or migraine (per CNM Q4-6 hrs prn). 14 tablet 0 Taking  . Butalbital-APAP-Caffeine 50-325-40 MG per capsule Take 1-2 capsules by mouth every 6 (six) hours as needed for headache. (Patient not taking: Reported on 07/30/2017) 30 capsule 3 Not Taking  . ibuprofen (ADVIL,MOTRIN) 600 MG tablet Take 1 tablet (600 mg total) by mouth every 6 (six) hours. (Patient not  taking: Reported on 07/30/2017) 30 tablet 0 Not Taking  . ondansetron (ZOFRAN ODT) 4 MG disintegrating tablet Take 1 tablet (4 mg total) by mouth every 8 (eight) hours as needed for nausea or vomiting. 20 tablet 0 Taking  . Prenatal Vit-Fe Fumarate-FA (PRENATAL MULTIVITAMIN) TABS tablet Take 1 tablet by mouth daily at 12 noon.   Taking    Review of Systems  Constitutional: Negative.   HENT: Negative.   Eyes: Negative.   Respiratory: Negative.   Cardiovascular: Negative.   Gastrointestinal: Negative.   Endocrine: Negative.   Genitourinary: Positive for pelvic pain (regular UC's).       DFM since this AM  Musculoskeletal: Negative.   Skin: Negative.   Allergic/Immunologic: Negative.   Neurological: Negative.   Hematological: Negative.   Psychiatric/Behavioral: Negative.    Physical Exam   Blood pressure 117/65, pulse (!) 106, temperature 97.9 F (36.6 C), resp. rate 16, height 5\' 6"  (1.676 m), weight 78 kg, SpO2 100 %.  Physical Exam  Nursing note and vitals reviewed. Constitutional: She is oriented to person, place, and time. She appears well-developed and well-nourished.  HENT:  Head: Normocephalic and atraumatic.  Eyes: Pupils are equal, round, and reactive to light.  Neck: Normal range of motion.  Cardiovascular: Normal rate.  Respiratory: Effort normal.  GI: Soft.  Genitourinary:    Genitourinary Comments: Dilation: 1 Effacement (%): 60 Cervical Position: Posterior Station: -3 Presentation: Vertex Exam by: Carloyn Jaeger, CNM    Musculoskeletal: Normal range of motion.  Neurological: She is alert and oriented to person, place, and time. She has normal reflexes.  Skin: Skin is warm and dry.  Psychiatric: She has a normal mood and affect. Her behavior is normal. Judgment and thought content normal.    MAU Course  Procedures  MDM NST - FHR: 130 bpm / moderate variability / accels present / decels absent / TOCO: irregular every 5-10 mins  Plan to recheck cervix in 1  hour  Care taken over by Arlan Organ, CNM @ [address], MSN, CNM 01/16/2019, 6:29 PM   Assessment and Plan

## 2019-01-16 NOTE — MAU Note (Signed)
Pt presents to MAU with complaints of contractions since 130 today. Denies any LOF or VB. +FM

## 2019-01-17 ENCOUNTER — Other Ambulatory Visit: Payer: Self-pay

## 2019-01-17 ENCOUNTER — Inpatient Hospital Stay (HOSPITAL_COMMUNITY)
Admission: AD | Admit: 2019-01-17 | Discharge: 2019-01-18 | DRG: 807 | Disposition: A | Discharge status: Home / Self Care | Payer: BLUE CROSS/BLUE SHIELD | Attending: Obstetrics | Admitting: Obstetrics

## 2019-01-17 ENCOUNTER — Encounter (HOSPITAL_COMMUNITY): Payer: Self-pay

## 2019-01-17 DIAGNOSIS — O368130 Decreased fetal movements, third trimester, not applicable or unspecified: Secondary | ICD-10-CM | POA: Diagnosis present

## 2019-01-17 DIAGNOSIS — Z3A40 40 weeks gestation of pregnancy: Secondary | ICD-10-CM

## 2019-01-17 DIAGNOSIS — O26893 Other specified pregnancy related conditions, third trimester: Secondary | ICD-10-CM | POA: Diagnosis present

## 2019-01-17 LAB — GROUP B STREP BY PCR: GROUP B STREP BY PCR: NEGATIVE

## 2019-01-17 LAB — CBC
HEMATOCRIT: 34.6 % — AB (ref 36.0–46.0)
Hemoglobin: 11.5 g/dL — ABNORMAL LOW (ref 12.0–15.0)
MCH: 27.3 pg (ref 26.0–34.0)
MCHC: 33.2 g/dL (ref 30.0–36.0)
MCV: 82.2 fL (ref 80.0–100.0)
NRBC: 0 % (ref 0.0–0.2)
PLATELETS: 256 10*3/uL (ref 150–400)
RBC: 4.21 MIL/uL (ref 3.87–5.11)
RDW: 13 % (ref 11.5–15.5)
WBC: 8.4 10*3/uL (ref 4.0–10.5)

## 2019-01-17 LAB — RPR: RPR Ser Ql: NONREACTIVE

## 2019-01-17 MED ORDER — OXYCODONE-ACETAMINOPHEN 5-325 MG PO TABS: 1.0000 | ORAL_TABLET | ORAL | Status: DC | PRN

## 2019-01-17 MED ORDER — SOD CITRATE-CITRIC ACID 500-334 MG/5ML PO SOLN: 30.0000 mL | ORAL | Status: DC | PRN

## 2019-01-17 MED ORDER — LACTATED RINGERS IV SOLN: 500.0000 mL | INTRAVENOUS | Status: DC | PRN

## 2019-01-17 MED ORDER — LIDOCAINE HCL (PF) 1 % IJ SOLN
30.0000 mL | INTRAMUSCULAR | Status: DC | PRN
  Administered 2019-01-17: 30 mL via SUBCUTANEOUS
  Filled 2019-01-17: qty 30

## 2019-01-17 MED ORDER — ONDANSETRON HCL 4 MG PO TABS
4.0000 mg | ORAL_TABLET | ORAL | Status: DC | PRN
  Administered 2019-01-17: 4 mg via ORAL
  Filled 2019-01-17: qty 1

## 2019-01-17 MED ORDER — OXYTOCIN 40 UNITS IN NORMAL SALINE INFUSION - SIMPLE MED: 2.5000 [IU]/h | INTRAVENOUS | Status: DC

## 2019-01-17 MED ORDER — PRENATAL MULTIVITAMIN CH
1.0000 | ORAL_TABLET | Freq: Every day | ORAL | Status: DC
  Filled 2019-01-17: qty 1

## 2019-01-17 MED ORDER — OXYCODONE-ACETAMINOPHEN 5-325 MG PO TABS: 2.0000 | ORAL_TABLET | ORAL | Status: DC | PRN

## 2019-01-17 MED ORDER — IBUPROFEN 600 MG PO TABS
600.0000 mg | ORAL_TABLET | Freq: Four times a day (QID) | ORAL | Status: DC
  Administered 2019-01-17 – 2019-01-18 (×4): 600 mg via ORAL
  Filled 2019-01-17 (×4): qty 1

## 2019-01-17 MED ORDER — BENZOCAINE-MENTHOL 20-0.5 % EX AERO
1.0000 "application " | INHALATION_SPRAY | CUTANEOUS | Status: DC | PRN
  Administered 2019-01-17: 1 via TOPICAL
  Filled 2019-01-17: qty 56

## 2019-01-17 MED ORDER — ONDANSETRON HCL 4 MG/2ML IJ SOLN: 4.0000 mg | INTRAMUSCULAR | Status: DC | PRN

## 2019-01-17 MED ORDER — IBUPROFEN 800 MG PO TABS
800.0000 mg | ORAL_TABLET | Freq: Once | ORAL | Status: AC
  Administered 2019-01-17: 800 mg via ORAL
  Filled 2019-01-17: qty 1

## 2019-01-17 MED ORDER — FLEET ENEMA 7-19 GM/118ML RE ENEM: 1.0000 | ENEMA | Freq: Every day | RECTAL | Status: DC | PRN

## 2019-01-17 MED ORDER — WITCH HAZEL-GLYCERIN EX PADS: 1.0000 "application " | MEDICATED_PAD | CUTANEOUS | Status: DC | PRN

## 2019-01-17 MED ORDER — ACETAMINOPHEN 325 MG PO TABS
650.0000 mg | ORAL_TABLET | ORAL | Status: DC | PRN
  Administered 2019-01-17 – 2019-01-18 (×4): 650 mg via ORAL
  Filled 2019-01-17 (×4): qty 2

## 2019-01-17 MED ORDER — DIPHENHYDRAMINE HCL 25 MG PO CAPS: 25.0000 mg | ORAL_CAPSULE | Freq: Four times a day (QID) | ORAL | Status: DC | PRN

## 2019-01-17 MED ORDER — ZOLPIDEM TARTRATE 5 MG PO TABS: 5.0000 mg | ORAL_TABLET | Freq: Every evening | ORAL | Status: DC | PRN

## 2019-01-17 MED ORDER — COCONUT OIL OIL
1.0000 "application " | TOPICAL_OIL | Status: DC | PRN
  Administered 2019-01-17: 1 via TOPICAL

## 2019-01-17 MED ORDER — OXYTOCIN BOLUS FROM INFUSION: 500.0000 mL | Freq: Once | INTRAVENOUS | Status: DC

## 2019-01-17 MED ORDER — ACETAMINOPHEN 325 MG PO TABS: 650.0000 mg | ORAL_TABLET | ORAL | Status: DC | PRN

## 2019-01-17 MED ORDER — ONDANSETRON HCL 4 MG/2ML IJ SOLN: 4.0000 mg | Freq: Four times a day (QID) | INTRAMUSCULAR | Status: DC | PRN

## 2019-01-17 MED ORDER — OXYTOCIN 10 UNIT/ML IJ SOLN
INTRAMUSCULAR | Status: AC
  Filled 2019-01-17: qty 1

## 2019-01-17 MED ORDER — SIMETHICONE 80 MG PO CHEW: 80.0000 mg | CHEWABLE_TABLET | ORAL | Status: DC | PRN

## 2019-01-17 MED ORDER — DIBUCAINE 1 % RE OINT: 1.0000 "application " | TOPICAL_OINTMENT | RECTAL | Status: DC | PRN

## 2019-01-17 MED ORDER — SENNOSIDES-DOCUSATE SODIUM 8.6-50 MG PO TABS
2.0000 | ORAL_TABLET | ORAL | Status: DC
  Administered 2019-01-17: 2 via ORAL
  Filled 2019-01-17: qty 2

## 2019-01-17 MED ORDER — LACTATED RINGERS IV SOLN: INTRAVENOUS | Status: DC

## 2019-01-17 NOTE — H&P (Signed)
OB ADMISSION/ HISTORY & PHYSICAL:  Admission Date: 01/17/2019  6:03 AM  Admit Diagnosis: Latent labor at 40+4 weeks   [name] is a 38 y.o. female G4P2 at 40+4 weeks presenting for labor at 40+4 weeks.  She has been experiencing prodromal labor for the past several days, and painful contractions started around 1am.  She was seen in the office and MAU yesterday for decreased fetal movement. She had a BPP of 6/8 in the office (-2 for breathing), but had a reactive Category 1 strip by the time she left MAU yesterday evening. Today, she endorses good fetal movement.   Prenatal History: [id number]   EDC : 01/13/2019, Date entered prior to episode creation  Prenatal care at [facility] since 10 weeks  Primary Ob Provider: Wiliam Ke, CNM/CNM Management  Prenatal course complicated by  - Hashimoto's Thyroiditis - not on meds - Hx. Of ectopic pregnancy with right salpingoophorectomy  - AMA  Prenatal Labs: ABO, Rh: --/--/O POS (03/17 7741) Antibody: NEG (03/17 2878) Rubella:   Immune RPR:   NR HBsAg:   Negative HIV:   NR GTT: 105 GBS:   Declined Informaseq: negative, XX AFP-1: Negative CUS: normal anatomy  Medical / Surgical History :  Past medical history:  Past Medical History:  Diagnosis Date  . Hashimoto's disease   . Hashimoto's disease   . Migraines   . PONV (postoperative nausea and vomiting)   . Seasonal allergies      Past surgical history:  Past Surgical History:  Procedure Laterality Date  . KNEE ARTHROSCOPY Right 12/2011   Microfracture  . KNEE ARTHROSCOPY Left 08/2008  . LAPAROSCOPIC UNILATERAL SALPINGECTOMY Right 02/13/2016   Procedure: LAPAROSCOPIC right salpingooophorectomy;  Surgeon: Suzy Bouchard, MD;  Location: [facility] ORS;  Service: Gynecology;  Laterality: Right;  . LAPAROSCOPY N/A 02/13/2016   Procedure: LAPAROSCOPY DIAGNOSTIC;  Surgeon: Suzy Bouchard, MD;  Location: [facility] ORS;  Service: Gynecology;  Laterality: N/A;  . TONSILLECTOMY AND  ADENOIDECTOMY      Family History:  Family History  Problem Relation Age of Onset  . Cancer Mother        stomach  . Hypertension Father   . Cancer Maternal Grandfather        brain  . Cancer Paternal Grandmother        breast  . Heart disease Paternal Grandfather   . Diabetes Mellitus I Sister   . Thyroid disease Neg Hx      Social History:  reports that she has never smoked. She has never used smokeless tobacco. She reports that she does not drink alcohol or use drugs.   Allergies: Cephalosporins; Fentanyl; Gluten meal; Oxycodone; and Penicillins    Current Medications at time of admission:  Prior to Admission medications   Medication Sig Start Date End Date Taking? Authorizing Provider  acetaminophen (TYLENOL) 325 MG tablet Take 2 tablets (650 mg total) by mouth every 4 (four) hours as needed (for pain scale < 4). 02/03/17   Shea Evans, MD  benzocaine-Menthol (DERMOPLAST) 20-0.5 % AERO Apply 1 application topically as needed for irritation (perineal discomfort). Patient not taking: Reported on 07/30/2017 02/03/17   Shea Evans, MD  butalbital-acetaminophen-caffeine (FIORICET, ESGIC) [phone] MG tablet Take 1 tablet by mouth every 4 (four) hours as needed for headache or migraine (per CNM Q4-6 hrs prn). 02/03/17   Shea Evans, MD  Butalbital-APAP-Caffeine [phone] MG per capsule Take 1-2 capsules by mouth every 6 (six) hours as needed for headache. Patient not taking: Reported on  07/30/2017 05/07/14   Tereso Newcomer, MD  ibuprofen (ADVIL,MOTRIN) 600 MG tablet Take 1 tablet (600 mg total) by mouth every 6 (six) hours. Patient not taking: Reported on 07/30/2017 02/03/17   Shea Evans, MD  ondansetron (ZOFRAN ODT) 4 MG disintegrating tablet Take 1 tablet (4 mg total) by mouth every 8 (eight) hours as needed for nausea or vomiting. 08/01/17   Minna Antis, MD  Prenatal Vit-Fe Fumarate-FA (PRENATAL MULTIVITAMIN) TABS tablet Take 1 tablet by mouth daily at 12 noon.     [provider]     Review of Systems: Active FM onset of ctx @ 0100 currently every 6 minutes No LOF  / SROM  No bloody show    Physical Exam:  VS: Blood pressure 123/77, pulse (!) 104, temperature 97.6 F (36.4 C), resp. rate (!) 22, SpO2 100 %, currently breastfeeding.  General: alert and oriented Heart: RRR Lungs: Clear lung fields Abdomen: Gravid, soft and non-tender, non-distended / uterus: gravid, non-tender Extremities: no edema  Genitalia / VE: Dilation: 3.5 Effacement (%): 80, 90 Station: -2 Exam by:: Roxan Hockey, RNC  FHR: baseline rate 125 / variability moderate / accelerations +15x15 / no decelerations TOCO: irregular  Assessment: 40+[redacted] weeks gestation Latent stage of labor FHR category 1 Declined GBS screening   Plan:  1. Admit to [facility]   - Routine labor and delivery orders   - Pain management: hydrotherapy   - Water immersion    - Intermittent fetal monitoring per protocol   - Lab draw only; no IV 2. GBS unknown    - Declined screening 3. Postpartum:   - Breast   - Contraception: 4. Anticipate MOD: NSVD/WB   - Proven pelvis: 7#14oz   Dr. Ernestina Penna notified of admission / plan of care  Carlean Jews, MSN, [facility]

## 2019-01-17 NOTE — Progress Notes (Signed)
Late entry note:  GBS swab previously declined by patient due to her taking Amoxicillin for sinusitis at time swab to be collected. Discussed screening guidelines, and patient agrees to swab. GBS by PCR performed postpartum.  GBS negative.   [name], CNM

## 2019-01-17 NOTE — Progress Notes (Signed)
S:  Breathing well through contractions; has been in water birth tub since 9am. Doula and husband at bedside for support.   O:  VS: Blood pressure 123/77, pulse (!) 104, temperature 97.6 F (36.4 C), resp. rate (!) 22, SpO2 100 %, currently breastfeeding.        FHR intermittent: 140-154bpm before during and after a contraction; no decelerations auscultated         Toco: contractions every 3-6 minutes / moderate         Cervix : deferred        Membranes: intact  A: Latent labor     FHR category 1    Hx. Of precipitous delivery  P: Water immersion      Continuous labor support from doula, husband, and CNM     Anticipate NSVD    Carlean Jews, MSN, CNM [facility]

## 2019-01-17 NOTE — Lactation Note (Signed)
This note was copied from a baby's chart. Lactation Consultation Note: Experienced BF mom, Baby now 5 hours old and has 2 previous feedings. Mom getting ready to latch baby as I went into room. Mom reports pain with latch- feels like she is biting and pinching. Adjusted lips but still continues to have pain- 5 out of 10 pain score. Nipple very pinched when she came off the breast. Suggested trying football hold and mom agreeable. Continues with pain. Tried other breast in football hold and mom states continued pain.. Both previous babies had tight frenulums and had tongue and lip revisions by Dr Orland Mustard in [place]- first one at 4 months and second at 1 week. Mom had mastitis several times and very sore nipples with the first baby. She pumped and bottle fed for a while before they figured out what was going on. Both babies latched well after procedure and went on to nurse for 14 and 18 months. Encouraged mom to keep baby close to the breast- deep latch. This baby tongue is cupped and does not rise to roof of mouth. DEBP setup for mom -reviewed setup, use and cleaning of pump pieces. Also #24 NS given for mom to try- does not want to try it yet. Used one in the past. No questions at present. BF brochure given with our phone number to call with questions or is wants to make OP appointment.   Patient Name: [name] [id number] Date: 01/17/2019 Reason for consult: Initial assessment   Maternal Data Formula Feeding for Exclusion: No Has patient been taught Hand Expression?: Yes Does the patient have breastfeeding experience prior to this delivery?: Yes  Feeding Feeding Type: Breast Fed  LATCH Score Latch: Repeated attempts needed to sustain latch, nipple held in mouth throughout feeding, stimulation needed to elicit sucking reflex.  Audible Swallowing: A few with stimulation  Type of Nipple: Everted at rest and after stimulation  Comfort (Breast/Nipple): Filling, red/small blisters or  bruises, mild/mod discomfort  Hold (Positioning): Assistance needed to correctly position infant at breast and maintain latch.  LATCH Score: 6  Interventions Interventions: Breast feeding basics reviewed;Hand express;Breast compression;Comfort gels;Coconut oil;Expressed milk;DEBP  Lactation Tools Discussed/Used Tools: Pump Breast pump type: Double-Electric Breast Pump WIC Program: No Pump Review: Setup, frequency, and cleaning Initiated by:: Dw Date initiated:: 01/17/19   Consult Status Consult Status: Follow-up Date: 01/18/19 Follow-up type: In-patient    Pamelia Hoit 01/17/2019, 2:51 PM

## 2019-01-17 NOTE — Progress Notes (Signed)
Pt called out to the front desk asking to see her nurse. This RN went in to check on pt. Pt c/o chills, nausea, and severe abdominal pain. Pt visibly shaking from chills. VS as follows; 117/82, HR 102, RR 22, temp 98.4F orally, O2 sat 100% on room air. Pt states her pain is located in her lower abdomen and "it feels like very bad cramping." This RN did a fundal assessment; fundus palpated to be firm midline and U/1. Small amount of lochia noted, no clots present.  This RN called to inform M. Sigmon, CNM of pt complaints and all information above. CNM agrees with RN to give pt tylenol, zofran, and apply heating packs to pt's lower abdomen.  This RN will continue to monitor pt.

## 2019-01-17 NOTE — Anesthesia Pain Management Evaluation Note (Signed)
  CRNA Pain Management Visit Note  Patient: [name], 38 y.o., female  "Hello I am a member of the anesthesia team at [facility]. We have an anesthesia team available at all times to provide care throughout the hospital, including epidural management and anesthesia for C-section. I don't know your plan for the delivery whether it a natural birth, water birth, IV sedation, nitrous supplementation, doula or epidural, but we want to meet your pain goals."   1.Was your pain managed to your expectations on prior hospitalizations?   Yes   2.What is your expectation for pain management during this hospitalization?     Water tub  3.How can we help you reach that goal?   Record the patient's initial score and the patient's pain goal.   Pain: 0  Pain Goal: 10 [facility] wants you to be able to say your pain was always managed very well.  Laban Emperor 01/17/2019

## 2019-01-17 NOTE — Progress Notes (Signed)
Late entry note:  S:  Pt. Out of tub at 8:58am to void; while on toilet, felt urge to push. Back in tub around 9:08am. Continuous labor support from doula, husband, and CNM  O:  VS: Blood pressure (!) 105/47, pulse (!) 104, temperature 97.6 F (36.4 C), resp. rate 20, SpO2 100 %, currently breastfeeding.        Intermittent FHR:         Toco: contractions every 2-3 minutes/strong        Cervix : Dilation: 10 Dilation Complete Date: 01/17/19 Dilation Complete Time: 0910 Effacement (%): 80, 90 Station: Plus 1, Plus 2 Presentation: Vertex Exam by:: Haide Klinker        Membranes: Intact - ruptured with pushing at 9:23am  A: Second stage of labor     FHR category 1     Hx. Of precipitous delivery    GBS declined screening   P: Anticipate NSVD/WB    Carlean Jews, MSN, CNM [facility]

## 2019-01-17 NOTE — MAU Note (Signed)
States her ctx got better after leaving last night then started to get stronger/closer this morning.  No ROM/VB.  + FM.  Plans waterbirth.

## 2019-01-18 LAB — CBC
HCT: 30.5 % — ABNORMAL LOW (ref 36.0–46.0)
Hemoglobin: 9.7 g/dL — ABNORMAL LOW (ref 12.0–15.0)
MCH: 26.8 pg (ref 26.0–34.0)
MCHC: 31.8 g/dL (ref 30.0–36.0)
MCV: 84.3 fL (ref 80.0–100.0)
Platelets: 231 10*3/uL (ref 150–400)
RBC: 3.62 MIL/uL — ABNORMAL LOW (ref 3.87–5.11)
RDW: 13 % (ref 11.5–15.5)
WBC: 9.9 10*3/uL (ref 4.0–10.5)
nRBC: 0 % (ref 0.0–0.2)

## 2019-01-18 LAB — TYPE AND SCREEN
ABO/RH(D): O POS
ANTIBODY SCREEN: NEGATIVE

## 2019-01-18 LAB — ABO/RH: ABO/RH(D): O POS

## 2019-01-18 MED ORDER — IBUPROFEN 800 MG PO TABS: 800.0000 mg | ORAL_TABLET | Freq: Four times a day (QID) | ORAL | 0 refills | Status: AC

## 2019-01-18 NOTE — Lactation Note (Signed)
This note was copied from a baby's chart. Lactation Consultation Note  Patient Name: [name] Date: 01/18/2019 Reason for consult: Follow-up assessment;Term;Nipple pain/trauma;Other (Comment)(possible tongue tie)  P3 mother whose infant is now 23 hours old.  Mother breast fed her first two children (almost 2 years and 40 years old) for 14 and 18 months respectively.  This is a term baby with a 6% weight loss.  Family desires early discharge.  Mother reported pain with latching since birth.  Both of her other children had tight frenulums with revisions performed at 4 months for the first child and 1 week with the second child.  Mother had a difficult time with the first child after delivery and stated she had gotten mastitis more than once.  Both parents feel certain that this child also has tongue and lip restrictions.  There is no family history of this with either parent.  I did not assess the baby at this time since she is sleeping on mother's chest.  Parents stated that the baby cups her tongue and cannot move it over the lower gum line.  Encouraged parents to have pediatrician perform an assessment this morning and to help devise a plan for seeing a specialist and possibly having this revised.  Provided a resource sheet for providers in our area that parents may contact for an evaluation.  Dr. Orland Mustard performed the revisions with the first two children.    Suggested parents come to an OP visit after discharge even if a revision is not performed to assess the baby's ability to feed well and gain weight.  Mother mentioned that she will probably do the same thing she did with her last child.  She had a Advertising copywriter make home visits since they do not live close to [place].  The family has private insurance and still had to pay out of pocket for this service and are willing to do this again if necessary.  Mother has a DEBP for home use and discussed pumping every 2-3  hours once at home.  She has a syringe for feeding her EBM and I also provided the foley cup with instructions for use.  Mother seems interested in trying this until baby can feed well at the breast.  Engorgement prevention/treatment reviewed.  She has a NS at bedside but is really not interested in using this at this time.    Father present and supportive.  He has already taken the initiative to begin calling for appointments for the baby.  Encouraged to call me for further questions/concerns prior to discharge if needed.  RN updated.  I feel confident in the parents' ability to care for this baby and supplement as needed until a plan can be determined for a possible revision.     Maternal Data Formula Feeding for Exclusion: No Has patient been taught Hand Expression?: Yes Does the patient have breastfeeding experience prior to this delivery?: Yes  Feeding    LATCH Score                   Interventions    Lactation Tools Discussed/Used WIC Program: No Initiated by:: Already initiated   Consult Status Consult Status: Complete Date: 01/18/19 Follow-up type: Call as needed    Starlett Pehrson R Luceil Herrin 01/18/2019, 9:41 AM

## 2019-01-18 NOTE — Progress Notes (Signed)
PPD 1 SVD S:  Reports feeling well - waiting to go home             Tolerating po/ No nausea or vomiting             Bleeding is light             Pain controlled with motrin             Up ad lib / ambulatory / voiding QS  Newborn Breast   O:   VS: BP 102/71 (BP Location: Right Arm)   Pulse 84   Temp 97.9 F (36.6 C) (Oral)   Resp 18   SpO2 100%   Breastfeeding Unknown   LABS:             Recent Labs    01/17/19 0652 01/18/19 0531  WBC 8.4 9.9  HGB 11.5* 9.7*  PLT 256 231               Blood type: --/--/O POS, O POS (03/17 0355)              Physical Exam:             Alert and oriented X3  Abdomen: soft, non-tender, non-distended              Fundus: firm, non-tender, Ueven  Lochia: light   Extremities: no edema, no calf pain or tenderness  A: PPD # 1   Doing well - stable status  P: Routine post partum orders  DC home  Marlinda Mike CNM, MSN, [facility] 01/18/2019, 3:17 PM

## 2019-01-18 NOTE — Progress Notes (Signed)
Clarification related to Peds notation in newborn chart and Peds communication to midwife regarding GBS prophylaxis.   CNM did not instruct patient to refuse screening or ABX, nor did a midwife instruct her that her oral prophylaxis for sinusitis was adequate for GBS prophylaxis. Patient refused screening and treatment after counseling and recommendation for screening and standard ABX prophylaxis. Patient stated that she specifically refused screening because she did not want "to be forced to take unnecessary antibiotics".   Communication to Peds that [facility] midwives recommend standard of care and do not recommend or encourage patients to deviate from standard practice. We do respect patient right to decline tests or treatment.   Marlinda Mike CNM [facility]

## 2019-01-18 NOTE — Discharge Summary (Signed)
Obstetric Discharge Summary Reason for Admission: onset of labor Prenatal Procedures: none Intrapartum Procedures: spontaneous vaginal delivery - declines GBS testing and treatment Postpartum Procedures: none Complications-Operative and Postpartum: none Hemoglobin  Date Value Ref Range Status  01/18/2019 9.7 (L) 12.0 - 15.0 g/dL Final  08/22/1172 56.7 g/dL Final   HCT  Date Value Ref Range Status  01/18/2019 30.5 (L) 36.0 - 46.0 % Final  03/09/2014 38 % Final    Physical Exam:  General: alert, cooperative and no distress Lochia: appropriate Uterine Fundus: firm Incision: healing well DVT Evaluation: No evidence of DVT seen on physical exam.  Discharge Diagnoses: Term Pregnancy-delivered  Discharge Information: Date: 01/18/2019 Activity: pelvic rest Diet: routine Medications: PNV and Ibuprofen Condition: stable Instructions: refer to practice specific booklet Discharge to: home Follow-up Information    [name], CNM. Schedule an appointment as soon as possible for a visit in 6 week(s).   Specialty:  Obstetrics and Gynecology Contact information: [address] [place] [address] [phone]           Newborn Data: Live born female  Birth Weight: 7 lb 6.3 oz (3355 g) APGAR: 9, 9  Newborn Delivery   Birth date/time:  01/17/2019 09:25:00 Delivery type:  Vaginal, Spontaneous     Home with mother.  [name] 01/18/2019, 4:03 PM

## 2020-07-18 DIAGNOSIS — Z3201 Encounter for pregnancy test, result positive: Secondary | ICD-10-CM | POA: Diagnosis not present

## 2020-07-18 DIAGNOSIS — Z6822 Body mass index (BMI) 22.0-22.9, adult: Secondary | ICD-10-CM | POA: Diagnosis not present

## 2020-07-18 DIAGNOSIS — O208 Other hemorrhage in early pregnancy: Secondary | ICD-10-CM | POA: Diagnosis not present

## 2020-07-18 DIAGNOSIS — O209 Hemorrhage in early pregnancy, unspecified: Secondary | ICD-10-CM | POA: Diagnosis not present

## 2020-07-18 DIAGNOSIS — Z3A01 Less than 8 weeks gestation of pregnancy: Secondary | ICD-10-CM | POA: Diagnosis not present

## 2020-07-18 DIAGNOSIS — G43909 Migraine, unspecified, not intractable, without status migrainosus: Secondary | ICD-10-CM | POA: Diagnosis not present

## 2020-07-22 DIAGNOSIS — O209 Hemorrhage in early pregnancy, unspecified: Secondary | ICD-10-CM | POA: Diagnosis not present

## 2020-07-25 DIAGNOSIS — O021 Missed abortion: Secondary | ICD-10-CM | POA: Diagnosis not present

## 2020-07-25 DIAGNOSIS — N938 Other specified abnormal uterine and vaginal bleeding: Secondary | ICD-10-CM | POA: Diagnosis not present

## 2020-07-25 DIAGNOSIS — Z3A01 Less than 8 weeks gestation of pregnancy: Secondary | ICD-10-CM | POA: Diagnosis not present
